# Patient Record
Sex: Male | Born: 1975 | Hispanic: No | Marital: Married | Smoking: Former smoker
Health system: Southern US, Community
[De-identification: ages and names within clinical notes are randomized; demographics above are authoritative.]

## PROBLEM LIST (undated history)

## (undated) DIAGNOSIS — Z8619 Personal history of other infectious and parasitic diseases: Secondary | ICD-10-CM

## (undated) DIAGNOSIS — F329 Major depressive disorder, single episode, unspecified: Secondary | ICD-10-CM

## (undated) DIAGNOSIS — J309 Allergic rhinitis, unspecified: Secondary | ICD-10-CM

## (undated) DIAGNOSIS — F439 Reaction to severe stress, unspecified: Secondary | ICD-10-CM

## (undated) DIAGNOSIS — F32A Depression, unspecified: Secondary | ICD-10-CM

## (undated) HISTORY — DX: Reaction to severe stress, unspecified: F43.9

## (undated) HISTORY — DX: Personal history of other infectious and parasitic diseases: Z86.19

## (undated) HISTORY — DX: Major depressive disorder, single episode, unspecified: F32.9

## (undated) HISTORY — DX: Allergic rhinitis, unspecified: J30.9

## (undated) HISTORY — PX: NO PAST SURGERIES: SHX2092

## (undated) HISTORY — DX: Depression, unspecified: F32.A

---

## 2008-08-15 ENCOUNTER — Ambulatory Visit: Payer: Self-pay | Admitting: Family Medicine

## 2009-05-30 ENCOUNTER — Emergency Department (HOSPITAL_COMMUNITY): Admission: EM | Admit: 2009-05-30 | Discharge: 2009-05-30 | Payer: Self-pay | Admitting: Emergency Medicine

## 2010-02-24 DIAGNOSIS — F411 Generalized anxiety disorder: Secondary | ICD-10-CM | POA: Insufficient documentation

## 2010-06-22 ENCOUNTER — Inpatient Hospital Stay: Payer: Self-pay | Admitting: Psychiatry

## 2010-12-23 ENCOUNTER — Emergency Department: Payer: Self-pay | Admitting: Otolaryngology

## 2014-03-25 ENCOUNTER — Emergency Department: Payer: Self-pay | Admitting: Emergency Medicine

## 2014-03-25 LAB — CBC WITH DIFFERENTIAL/PLATELET
BASOS PCT: 0.4 %
Basophil #: 0 10*3/uL (ref 0.0–0.1)
EOS ABS: 0.1 10*3/uL (ref 0.0–0.7)
EOS PCT: 1.1 %
HCT: 39 % — AB (ref 40.0–52.0)
HGB: 13.4 g/dL (ref 13.0–18.0)
LYMPHS PCT: 7.9 %
Lymphocyte #: 0.7 10*3/uL — ABNORMAL LOW (ref 1.0–3.6)
MCH: 29.6 pg (ref 26.0–34.0)
MCHC: 34.4 g/dL (ref 32.0–36.0)
MCV: 86 fL (ref 80–100)
MONOS PCT: 4.8 %
Monocyte #: 0.4 x10 3/mm (ref 0.2–1.0)
NEUTROS ABS: 7.6 10*3/uL — AB (ref 1.4–6.5)
Neutrophil %: 85.8 %
Platelet: 230 10*3/uL (ref 150–440)
RBC: 4.54 10*6/uL (ref 4.40–5.90)
RDW: 12.7 % (ref 11.5–14.5)
WBC: 8.9 10*3/uL (ref 3.8–10.6)

## 2014-03-25 LAB — COMPREHENSIVE METABOLIC PANEL
ALBUMIN: 3.6 g/dL (ref 3.4–5.0)
ALK PHOS: 60 U/L (ref 46–116)
ALT: 98 U/L — AB (ref 14–63)
AST: 63 U/L — AB (ref 15–37)
Anion Gap: 8 (ref 7–16)
BUN: 7 mg/dL (ref 7–18)
Bilirubin,Total: 0.7 mg/dL (ref 0.2–1.0)
CALCIUM: 8.6 mg/dL (ref 8.5–10.1)
CO2: 26 mmol/L (ref 21–32)
Chloride: 105 mmol/L (ref 98–107)
Creatinine: 1 mg/dL (ref 0.60–1.30)
EGFR (African American): >60
GLUCOSE: 104 mg/dL — AB (ref 65–99)
Osmolality: 276 (ref 275–301)
Potassium: 3.3 mmol/L — ABNORMAL LOW (ref 3.5–5.1)
Sodium: 139 mmol/L (ref 136–145)
Total Protein: 7.2 g/dL (ref 6.4–8.2)

## 2014-03-25 LAB — URINALYSIS, COMPLETE
BILIRUBIN, UR: NEGATIVE
BLOOD: NEGATIVE
Bacteria: NONE SEEN
GLUCOSE, UR: NEGATIVE mg/dL (ref 0–75)
KETONE: NEGATIVE
Leukocyte Esterase: NEGATIVE
NITRITE: NEGATIVE
Ph: 5 (ref 4.5–8.0)
Protein: 30
RBC,UR: NONE SEEN /HPF (ref 0–5)
SPECIFIC GRAVITY: 1.017 (ref 1.003–1.030)
Squamous Epithelial: NONE SEEN
WBC UR: 1 /HPF (ref 0–5)

## 2014-03-27 ENCOUNTER — Emergency Department: Payer: Self-pay | Admitting: Emergency Medicine

## 2014-03-31 LAB — CULTURE, BLOOD (SINGLE)

## 2015-07-09 ENCOUNTER — Encounter: Payer: Self-pay | Admitting: Family Medicine

## 2015-07-09 ENCOUNTER — Ambulatory Visit (INDEPENDENT_AMBULATORY_CARE_PROVIDER_SITE_OTHER): Payer: BLUE CROSS/BLUE SHIELD | Admitting: Family Medicine

## 2015-07-09 VITALS — BP 100/74 | HR 81 | Temp 98.6°F | Resp 16 | Ht 70.0 in | Wt 175.6 lb

## 2015-07-09 DIAGNOSIS — F439 Reaction to severe stress, unspecified: Secondary | ICD-10-CM | POA: Insufficient documentation

## 2015-07-09 DIAGNOSIS — F329 Major depressive disorder, single episode, unspecified: Secondary | ICD-10-CM | POA: Insufficient documentation

## 2015-07-09 DIAGNOSIS — W57XXXA Bitten or stung by nonvenomous insect and other nonvenomous arthropods, initial encounter: Secondary | ICD-10-CM

## 2015-07-09 DIAGNOSIS — S30861A Insect bite (nonvenomous) of abdominal wall, initial encounter: Secondary | ICD-10-CM

## 2015-07-09 DIAGNOSIS — A630 Anogenital (venereal) warts: Secondary | ICD-10-CM | POA: Diagnosis not present

## 2015-07-09 DIAGNOSIS — J309 Allergic rhinitis, unspecified: Secondary | ICD-10-CM | POA: Insufficient documentation

## 2015-07-09 DIAGNOSIS — F32A Depression, unspecified: Secondary | ICD-10-CM | POA: Insufficient documentation

## 2015-07-09 MED ORDER — DOXYCYCLINE HYCLATE 100 MG PO TABS: 100.0000 mg | ORAL_TABLET | Freq: Two times a day (BID) | ORAL | Status: DC

## 2015-07-09 MED ORDER — IMIQUIMOD 3.75 % EX CREA: 1.0000 "application " | TOPICAL_CREAM | CUTANEOUS | Status: DC

## 2015-07-09 NOTE — Progress Notes (Signed)
Subjective:     Patient ID: Glenn Rodriguez, male   DOB: 21-Feb-1976, 40 y.o.   MRN: [id number]  HPI  Chief Complaint  Patient presents with  . Insect Bite    Patient comes in office today with concerns of tick bite that he believes attatched to his skin 3-4 days ago. Patient states that he found tick on the right side of his abdomen, he soak his skin with rubbing alcohol and plucked it with tweezers. Patient states that he pulled entire tick off and was able to see head still attached. Patient states that he has developed redness and swelling and site of bite and now has redness around area.   Has been applying Benadryl cream with modest improvement. Also wishes to try Aldara cream for his genital warts.   Review of Systems     Objective:   Physical Exam  Constitutional: He appears well-developed and well-nourished. No distress.  Skin:  Right upper abdomen with insect bite with approx. 4 cm. Inflammatory flare.        Assessment:    1. Tick bite of abdomen, initial encounter - doxycycline (VIBRA-TABS) 100 MG tablet; Take 1 tablet (100 mg total) by mouth 2 (two) times daily.  Dispense: 20 tablet; Refill: 0  2. Genital warts - Imiquimod 3.75 % CREA; Apply 1 application topically 3 (three) times a week. 3 x week at bedtime for up to 16 weeks. Wash off after 8 hours.  Dispense: 7.5 each; Refill: 1    Plan:    Discussed trying hydrocortisone cream to his tick bite first. If increasing redness or flu like symptoms to start doxycycline.

## 2015-07-09 NOTE — Patient Instructions (Signed)
Discussed use of hydrocortisone cream for the tick bite.Start doxycycline if rash widening or you develop flu-like symptoms.

## 2015-10-13 IMAGING — CR DG CHEST 2V
1 series · 2 of 2 positions shown · non-contrast
Comparison: None.

CLINICAL DATA: Cough and intermittent fever since 03/22/2014.

EXAM:
CHEST  2 VIEW

[Series 1: dxr chest pa (or ap) and lateral · 0.14mm/px · 2 of 2 slices shown]
[im 1/2]
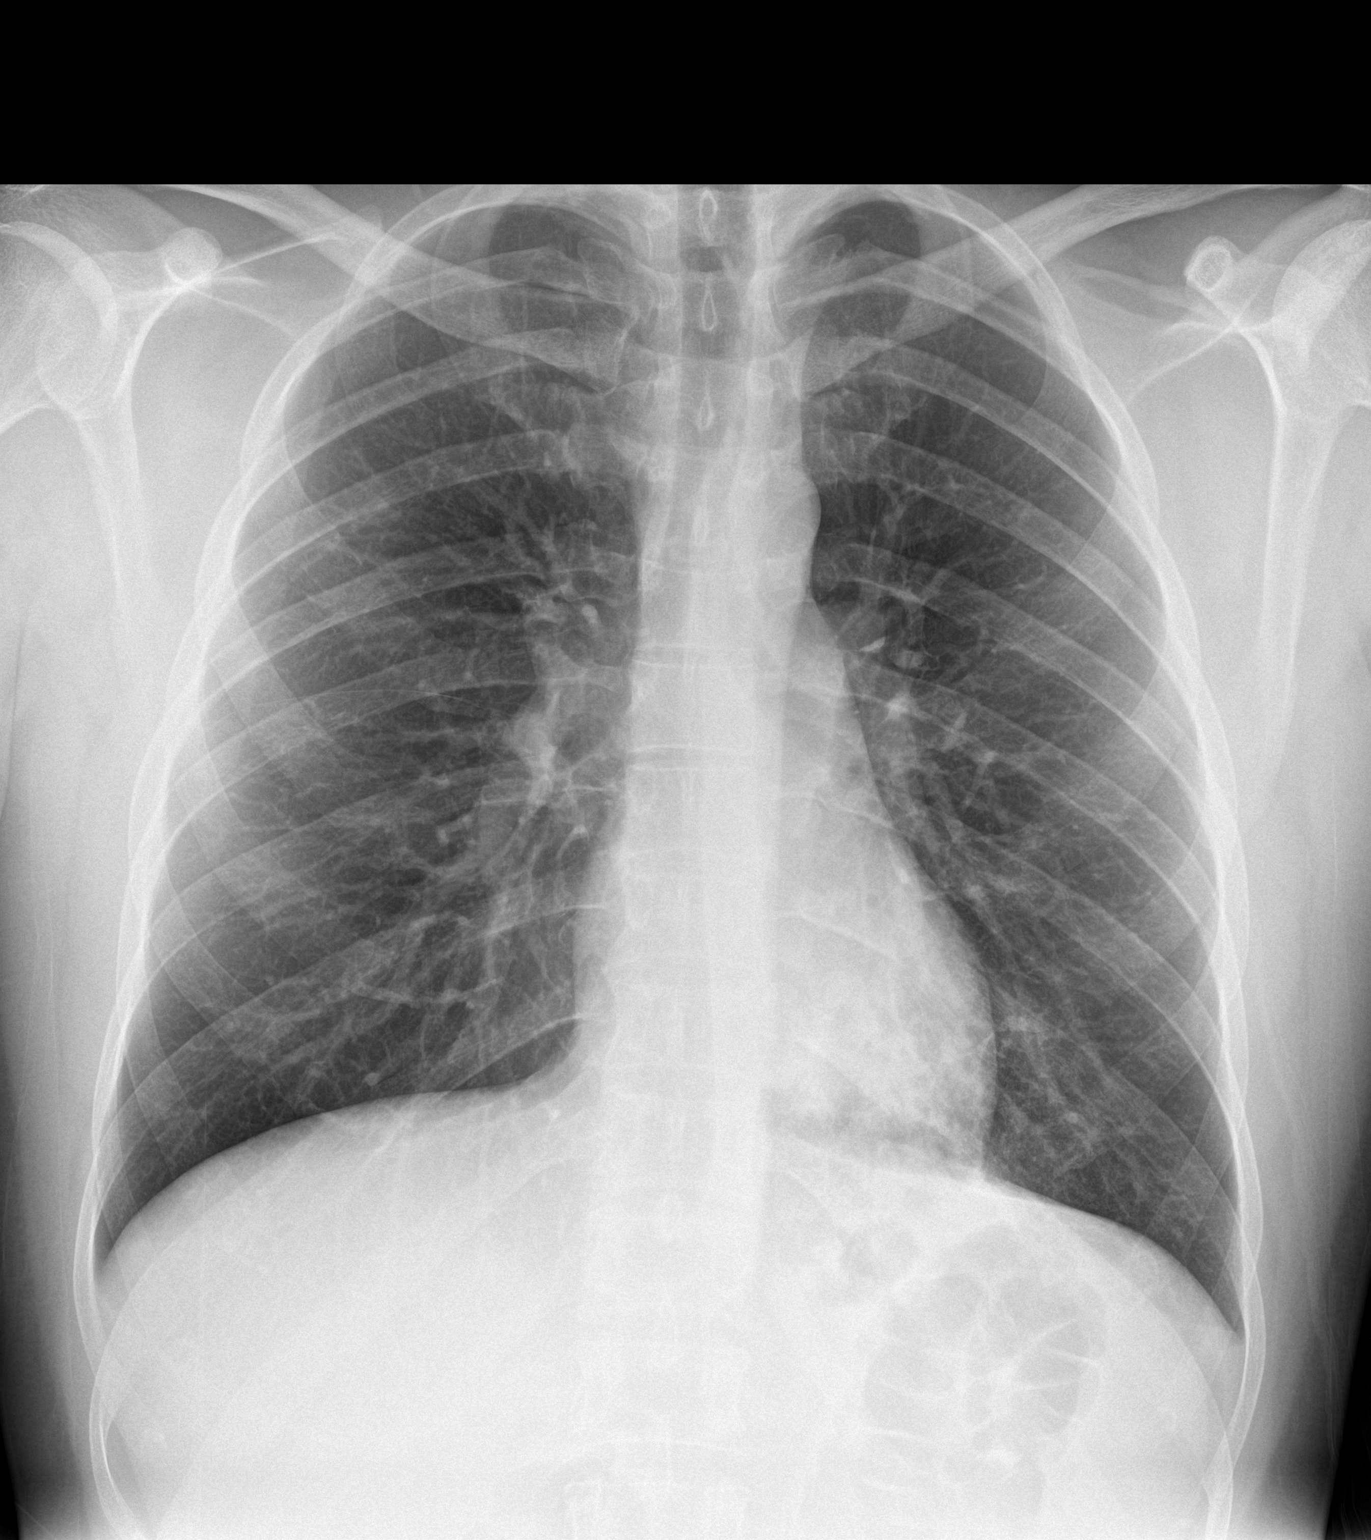
[im 2/2]
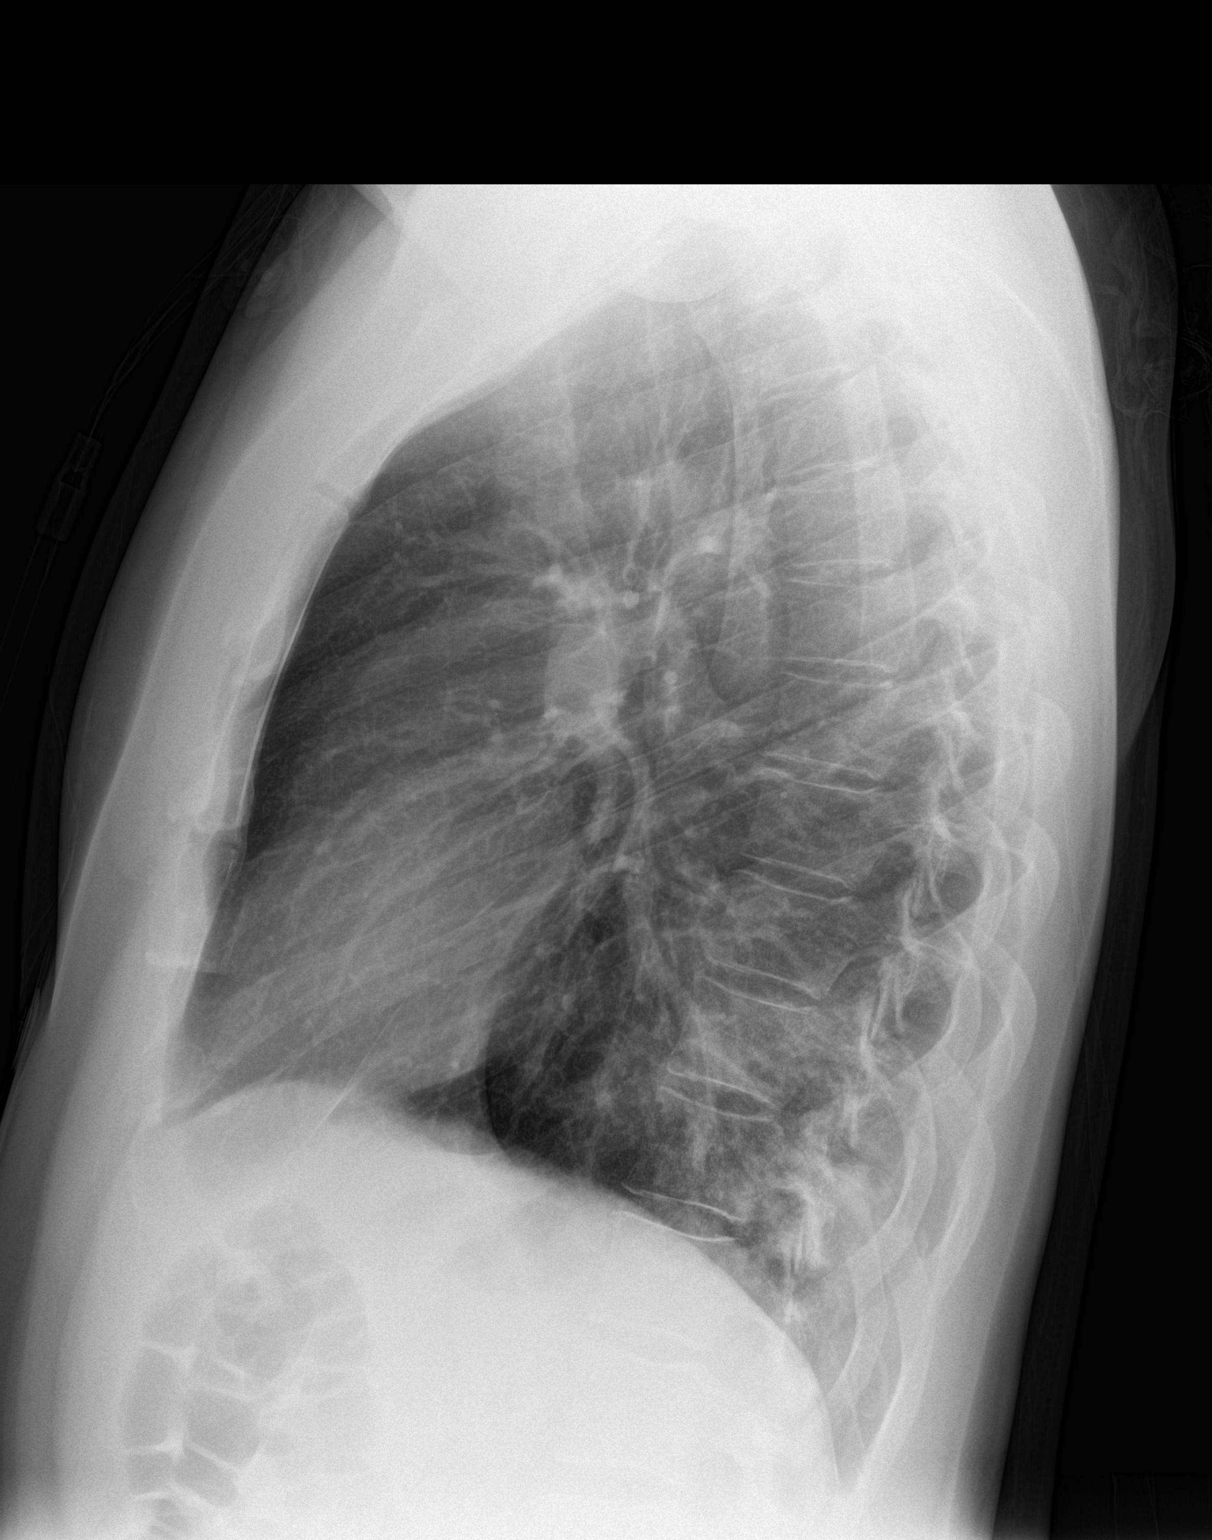

[2 of 2 positions shown; findings below may reference images not displayed]

FINDINGS: Normal heart size and pulmonary vascularity. Emphysematous changes
in the lungs. No focal airspace disease or consolidation. No
blunting of costophrenic angles. No pneumothorax. Mediastinal
contours appear intact. Visualized bones appear intact.
IMPRESSION: Emphysematous changes in the lungs. No evidence of active pulmonary
disease.

## 2015-10-14 ENCOUNTER — Emergency Department
Admission: EM | Admit: 2015-10-14 | Discharge: 2015-10-14 | Disposition: A | Discharge status: Home / Self Care | Payer: BLUE CROSS/BLUE SHIELD | Attending: Emergency Medicine | Admitting: Emergency Medicine

## 2015-10-14 ENCOUNTER — Encounter: Payer: Self-pay | Admitting: Emergency Medicine

## 2015-10-14 DIAGNOSIS — R42 Dizziness and giddiness: Secondary | ICD-10-CM | POA: Diagnosis not present

## 2015-10-14 DIAGNOSIS — Z7951 Long term (current) use of inhaled steroids: Secondary | ICD-10-CM | POA: Insufficient documentation

## 2015-10-14 DIAGNOSIS — R0602 Shortness of breath: Secondary | ICD-10-CM | POA: Diagnosis present

## 2015-10-14 LAB — BASIC METABOLIC PANEL
ANION GAP: 11 (ref 5–15)
BUN: 20 mg/dL (ref 6–20)
CALCIUM: 9.7 mg/dL (ref 8.9–10.3)
CHLORIDE: 105 mmol/L (ref 101–111)
CO2: 22 mmol/L (ref 22–32)
CREATININE: 1.16 mg/dL (ref 0.61–1.24)
GFR calc non Af Amer: >60 mL/min (ref >60)
Glucose, Bld: 107 mg/dL — ABNORMAL HIGH (ref 65–99)
Potassium: 4 mmol/L (ref 3.5–5.1)
SODIUM: 138 mmol/L (ref 135–145)

## 2015-10-14 LAB — CBC
HCT: 44 % (ref 40.0–52.0)
HEMOGLOBIN: 15.3 g/dL (ref 13.0–18.0)
MCH: 29.7 pg (ref 26.0–34.0)
MCHC: 34.7 g/dL (ref 32.0–36.0)
MCV: 85.7 fL (ref 80.0–100.0)
PLATELETS: 309 10*3/uL (ref 150–440)
RBC: 5.14 MIL/uL (ref 4.40–5.90)
RDW: 12.8 % (ref 11.5–14.5)
WBC: 7.1 10*3/uL (ref 3.8–10.6)

## 2015-10-14 LAB — TROPONIN I

## 2015-10-14 MED ORDER — SODIUM CHLORIDE 0.9 % IV BOLUS (SEPSIS)
1000.0000 mL | Freq: Once | INTRAVENOUS | Status: AC
  Administered 2015-10-14: 1000 mL via INTRAVENOUS

## 2015-10-14 MED ORDER — LORAZEPAM 2 MG/ML IJ SOLN
0.5000 mg | Freq: Once | INTRAMUSCULAR | Status: AC
  Administered 2015-10-14: 0.5 mg via INTRAVENOUS
  Filled 2015-10-14: qty 1

## 2015-10-14 NOTE — ED Triage Notes (Signed)
Pt presents to ED via EMS from church c/o feelin jittery, dizzy, lightheaded, and short of breath starting around 1120. Pt states it feels similar to previous anxiety attacks but states this one is worse. CBG 119 per EMS. No c/o pain at this time.

## 2015-10-14 NOTE — ED Provider Notes (Signed)
[facility] Emergency Department Provider Note  Time seen: 1:57 PM  I have reviewed the triage vital signs and the nursing notes.   HISTORY  Chief Complaint Anxiety and Shortness of Breath    HPI Glenn Rodriguez is a 40 y.o. male with a past medical history of anxiety, depression, who presents the emergency department with dizziness/lightheadedness. According to the patient last week he went for a run, states he ran approximately 1 mile however upon finishing he felt like his heart was racing, he felt lightheaded and dizzy like he was going to pass out. States the symptoms last approximately 10-15 minutes and then resolved on their own. Today the patient states he worked out this morning, and then he went to church, while at church she began feeling very similar with lightheadedness like he was going to pass out. Patient states a history of anxiety and panic attacks was somewhat similar presentations in the past but never this severe. He states his symptoms have resolved at this time, and he feels mostly back to normal. Denies any chest pain at any point. Denies any trouble breathing. Patient does state he uses supplements while at the gym, and drinks energy drinks.  Past Medical History:  Diagnosis Date  . Allergic rhinitis   . Depression   . History of genital warts   . Situational stress     Patient Active Problem List   Diagnosis Date Noted  . Feeling stressed out 07/09/2015  . Genital warts 07/09/2015  . Allergic rhinitis 07/09/2015    Past Surgical History:  Procedure Laterality Date  . NO PAST SURGERIES      Prior to Admission medications   Medication Sig Start Date End Date Taking? Authorizing Provider  albuterol (PROVENTIL HFA;VENTOLIN HFA) 108 (90 Base) MCG/ACT inhaler Inhale 2 puffs into the lungs every 6 (six) hours as needed for wheezing or shortness of breath.   Yes Historical Provider, MD  clonazePAM (KLONOPIN) 0.5 MG tablet Take 0.5 mg by  mouth 2 (two) times daily as needed for anxiety.  06/08/14  Yes Historical Provider, MD  doxycycline (VIBRA-TABS) 100 MG tablet Take 1 tablet (100 mg total) by mouth 2 (two) times daily. Patient not taking: Reported on 10/14/2015 07/09/15   Anola Gurneyobert Chauvin, PA  Imiquimod 3.75 % CREA Apply 1 application topically 3 (three) times a week. 3 x week at bedtime for up to 16 weeks. Wash off after 8 hours. 07/09/15   Anola Gurneyobert Chauvin, PA    Allergies  Allergen Reactions  . Amoxicillin Other (See Comments)    Childhood reaction-unknown  . Anesthetics, Halogenated Other (See Comments)    Unknown reaction    Family History  Problem Relation Age of Onset  . Depression Mother     Social History Social History  Substance Use Topics  . Smoking status: Never Smoker  . Smokeless tobacco: Never Used  . Alcohol use 0.0 oz/week     Comment: occasional     Review of Systems Constitutional: Negative for fever. Cardiovascular: Negative for chest pain.Dizziness/lightheadedness. Respiratory: Negative for shortness of breath. Gastrointestinal: Negative for abdominal pain Neurological: Negative for headache 10-point ROS otherwise negative.  ____________________________________________   PHYSICAL EXAM:  VITAL SIGNS: ED Triage Vitals [10/14/15 1215]  Enc Vitals Group     BP (!) 139/91     Pulse Rate 86     Resp 20     Temp 97.8 F (36.6 C)     Temp Source Oral     SpO2  100 %     Weight 178 lb (80.7 kg)     Height 5\' 10"  (1.778 m)     Head Circumference      Peak Flow      Pain Score      Pain Loc      Pain Edu?      Excl. in GC?     Constitutional: Alert and oriented. Well appearing and in no distress. Eyes: Normal exam ENT   Head: Normocephalic and atraumatic   Mouth/Throat: Mucous membranes are moist. Cardiovascular: Normal rate, regular rhythm. No murmurs, rubs, or gallops. Respiratory: Normal respiratory effort without tachypnea nor retractions. Breath sounds are clear and  equal bilaterally. No wheezes/rales/rhonchi. Gastrointestinal: Soft and nontender. No distention Musculoskeletal: Nontender with normal range of motion in all extremities.  Neurologic:  Normal speech and language. No gross focal neurologic deficits  Skin:  Skin is warm, dry and intact.  Psychiatric: Mood and affect are normal. Speech and behavior are normal.   ____________________________________________     EKG   EKG reviewed and interpreted by myself shows normal sinus rhythm at 71 bpm, narrow QRS, normal axis, normal intervals, no concerning ST changes.  ____________________________________________   INITIAL IMPRESSION / ASSESSMENT AND PLAN / ED COURSE  Pertinent labs & imaging results that were available during my care of the patient were reviewed by me and considered in my medical decision making (see chart for details).  The patient presents emergency department with dizziness/lightheadedness. States his symptoms have largely resolved with just mild lightheadedness currently. Denies any chest pain at any point. Patient states his main concern is making sure he has not suffered a heart attack. Denies any chest pain.  Patient's labs are within normal limits. Troponin is negative. EKG is reassuring. Patient just a small amount of Ativan and IV fluids in the emergency department, states he feels much better. I discussed with the patient the need to follow up with cardiology for Holter monitor test. Patient is agreeable to this plan.  ____________________________________________   FINAL CLINICAL IMPRESSION(S) / ED DIAGNOSES  Dizziness    Minna AntisKevin Maeve Debord, MD 10/14/15 1401

## 2015-10-14 NOTE — Discharge Instructions (Signed)
Please call the number provided for cardiology to arrange a Holter monitor test. Please return to the emergency department for any chest pain, trouble breathing, or any further concerning lightheadedness/dizziness.

## 2015-10-14 NOTE — ED Notes (Signed)

## 2015-10-16 ENCOUNTER — Other Ambulatory Visit: Payer: Self-pay | Admitting: Family Medicine

## 2015-10-16 NOTE — Telephone Encounter (Signed)
Rx has been called into pharmacy. KW 

## 2015-10-19 ENCOUNTER — Ambulatory Visit (INDEPENDENT_AMBULATORY_CARE_PROVIDER_SITE_OTHER): Payer: BLUE CROSS/BLUE SHIELD | Admitting: Family Medicine

## 2015-10-19 ENCOUNTER — Encounter: Payer: Self-pay | Admitting: Family Medicine

## 2015-10-19 VITALS — BP 114/80 | HR 86 | Temp 98.7°F | Resp 16 | Wt 178.6 lb

## 2015-10-19 DIAGNOSIS — Z658 Other specified problems related to psychosocial circumstances: Secondary | ICD-10-CM

## 2015-10-19 DIAGNOSIS — F439 Reaction to severe stress, unspecified: Secondary | ICD-10-CM

## 2015-10-19 DIAGNOSIS — R002 Palpitations: Secondary | ICD-10-CM
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
## 2015-10-19 NOTE — Patient Instructions (Addendum)
We will call you with the lab work. Do schedule clonazepam daily. Follow up with cardiology as scheduled.

## 2015-10-20 LAB — TSH: TSH: 0.578 u[IU]/mL (ref 0.450–4.500)

## 2015-10-20 LAB — T4, FREE: Free T4: 1.13 ng/dL (ref 0.82–1.77)

## 2015-10-22 ENCOUNTER — Telehealth: Payer: Self-pay

## 2015-10-22 NOTE — Telephone Encounter (Signed)
-----   Message from Anola Gurneyobert Chauvin, [place] sent at 10/22/2015  7:33 AM EDT ----- Thyroid is norma. F/u with cardiology as scheduled.

## 2015-10-22 NOTE — Telephone Encounter (Signed)
Patient has been advised. KW 

## 2015-10-23 ENCOUNTER — Encounter: Payer: Self-pay | Admitting: Cardiology

## 2015-10-23 ENCOUNTER — Ambulatory Visit (INDEPENDENT_AMBULATORY_CARE_PROVIDER_SITE_OTHER): Payer: BLUE CROSS/BLUE SHIELD | Admitting: Cardiology

## 2015-10-23 VITALS — BP 110/80 | HR 66 | Ht 70.0 in | Wt 176.8 lb

## 2015-10-23 DIAGNOSIS — R002 Palpitations: Secondary | ICD-10-CM | POA: Diagnosis not present

## 2015-10-23 DIAGNOSIS — R0602 Shortness of breath: Secondary | ICD-10-CM

## 2015-10-23 DIAGNOSIS — Z7189 Other specified counseling: Secondary | ICD-10-CM | POA: Diagnosis not present

## 2015-10-23 DIAGNOSIS — Z7689 Persons encountering health services in other specified circumstances: Secondary | ICD-10-CM

## 2015-10-23 DIAGNOSIS — R079 Chest pain, unspecified: Secondary | ICD-10-CM | POA: Diagnosis not present

## 2015-10-23 NOTE — Progress Notes (Signed)
Cardiology Office Note   Date:  10/23/2015   ID:  Glenn Rodriguez, DOB March 10, 1975, MRN [id number]  Referring Doctor:  Carmon Ginsberg, PA   Cardiologist:   Wende Bushy, MD   Reason for consultation:  Chief Complaint  Patient presents with  . Establish Care    follow to ED visit for SOB      History of Present Illness: Glenn Rodriguez is a 40 y.o. male who presents for Evaluation of a myriad of symptoms: Shortness of breath, palpitations, chest tightness.  Patient first noted symptoms approximately 2 months ago. He was doing his usual workout chest and upper extremity exercises. He was doing a series of lifts of approximately 65 pounds. He was doing multiple sets of 20s. He noticed that he was starting to feel lightheaded vision turning dark. He also had onset of rapid heart rate that was associated with the shortness of breath and chest tightness. The symptoms are moderate to severe in intensity, lasted at the tail end of his exercise and for a few minutes after that. He was thinking that if he started running and take deep breaths, this may help with his symptoms. He ran for half a mile and then walked another half a mile after that. After this, he started having lightheadedness again together with mild chest tightness and palpitations.  Another episode while at church. He had been standing up for approximately 10 minutes when he started feeling lightheaded and faint. He noticed his heart was racing, this was associated with chest tightness and shortness of breath. His symptoms were severe and he ended up having them call EMS. When EMS came, his blood pressure was noted to be 180/110 with heart rates in the 140s to 150s. When he got to the emergency room, his blood pressure was 333 systolic. His heart rate had improved by then. It was thought that his symptoms may be related to anxiety or panic attacks. He has a history of this. But it was thought that he may need to be ruled out for any  cardiac issue.  His PCP advised him to stop drinking his Monster energy drinks. He continues to drink his one serving of coffee in the morning. This is equivalent to a grande serving, very strong coffee. He also continues to drink one to 3 beers in the evenings.  No true syncope. No PND, orthopnea, edema. No fever, cough, colds, abdominal pain.  He does not carry a diagnosis of asthma. Albuterol inhaler was prescribed to him after he had pneumonia.   ROS:  Please see the history of present illness. Aside from mentioned under HPI, all other systems are reviewed and negative.     Past Medical History:  Diagnosis Date  . Allergic rhinitis   . Depression   . History of genital warts   . Situational stress     Past Surgical History:  Procedure Laterality Date  . NO PAST SURGERIES       reports that he has never smoked. He has never used smokeless tobacco. He reports that he drinks alcohol. He reports that he does not use drugs. He quit smoking 14 years ago. He quit chewing tobacco for 25 years ago.  family history includes Depression in his mother. There is history of arrhythmia in the mother and maternal aunt and maternal grandmother. No known CAD.  Outpatient Medications Prior to Visit  Medication Sig Dispense Refill  . albuterol (PROVENTIL HFA;VENTOLIN HFA) 108 (90 Base) MCG/ACT inhaler Inhale  2 puffs into the lungs every 6 (six) hours as needed for wheezing or shortness of breath.    . clonazePAM (KLONOPIN) 0.5 MG tablet TAKE 1 TABLET BY MOUTH TWICE DAILY AS NEEDED FOR ANXIETY AND STRESS 28 tablet 1  . Imiquimod 3.75 % CREA Apply 1 application topically 3 (three) times a week. 3 x week at bedtime for up to 16 weeks. Wash off after 8 hours. 7.5 each 1   No facility-administered medications prior to visit.      Allergies: Amoxicillin and Anesthetics, halogenated    PHYSICAL EXAM: VS:  BP 110/80 (BP Location: Right Arm, Patient Position: Sitting, Cuff Size: Normal)   Pulse 66    Ht 5' 10"  (1.778 m)   Wt 176 lb 12.8 oz (80.2 kg)   SpO2 98%   BMI 25.37 kg/m  , Body mass index is 25.37 kg/m. Wt Readings from Last 3 Encounters:  10/23/15 176 lb 12.8 oz (80.2 kg)  10/19/15 178 lb 9.6 oz (81 kg)  10/14/15 178 lb (80.7 kg)    GENERAL:  well developed, well nourished, not in acute distress HEENT: normocephalic, pink conjunctivae, anicteric sclerae, no xanthelasma, normal dentition, oropharynx clear NECK:  no neck vein engorgement, JVP normal, no hepatojugular reflux, carotid upstroke brisk and symmetric, no bruit, no thyromegaly, no lymphadenopathy LUNGS:  good respiratory effort, clear to auscultation bilaterally CV:  PMI not displaced, no thrills, no lifts, S1 and S2 within normal limits, no palpable S3 or S4, no murmurs, no rubs, no gallops ABD:  Soft, nontender, nondistended, normoactive bowel sounds, no abdominal aortic bruit, no hepatomegaly, no splenomegaly MS: nontender back, no kyphosis, no scoliosis, no joint deformities EXT:  2+ DP/PT pulses, no edema, no varicosities, no cyanosis, no clubbing SKIN: warm, nondiaphoretic, normal turgor, no ulcers NEUROPSYCH: alert, oriented to person, place, and time, sensory/motor grossly intact, normal mood, appropriate affect  Recent Labs: 10/14/2015: BUN 20; Creatinine, Ser 1.16; Hemoglobin 15.3; Platelets 309; Potassium 4.0; Sodium 138 10/19/2015: TSH 0.578   Lipid Panel No results found for: CHOL, TRIG, HDL, CHOLHDL, VLDL, LDLCALC, LDLDIRECT   Other studies Reviewed:  EKG:  The ekg from 10/14/2015, and the ER was personally reviewed by me and it revealed sinus rhythm, 71 bpm.  EKG from 10/23/2015 was personally reviewed by me and it revealed sinus rhythm, 64 BPM.  Additional studies/ records that were reviewed personally reviewed by me today include: None available   ASSESSMENT AND PLAN: Paplpitations that leads to shortness of breath and chest tightness. Episodes of lightheadedness  Recommend further  evaluation with an echocardiogram Recommend cardiac event monitor, patient does not have daily palpitations. Also need to rule out ischemia with a stress echocardiogram.   Current medicines are reviewed at length with the patient today.  The patient does not have concerns regarding medicines.  Labs/ tests ordered today include:  Orders Placed This Encounter  Procedures  . Cardiac event monitor  . EKG 12-Lead  . ECHOCARDIOGRAM COMPLETE  . ECHOCARDIOGRAM STRESS TEST    I had a lengthy and detailed discussion with the patient regarding diagnoses, prognosis, diagnostic options, treatment options , and side effects of medications.   I counseled the patient on importance of lifestyle modification including heart healthy diet, regular physical activity.   Disposition:   FU with undersigned after tests   I spent at least 60 minutes with the patient today and more than 50% of the time was spent counseling the patient and coordinating care.     Signed, Wende Bushy,  MD  10/23/2015 11:15 AM    [place]  This note was generated in part with voice recognition software and I apologize for any typographical errors that were not detected and corrected.

## 2015-10-23 NOTE — Patient Instructions (Addendum)
Testing/Procedures: Your physician has requested that you have an echocardiogram. Echocardiography is a painless test that uses sound waves to create images of your heart. It provides your doctor with information about the size and shape of your heart and how well your heart's chambers and valves are working. This procedure takes approximately one hour. There are no restrictions for this procedure.  Your physician has requested that you have a stress echocardiogram. For further information please visit [email or web address]. Please follow instruction sheet as given.  Your physician has recommended that you wear an event monitor. Event monitors are medical devices that record the heart's electrical activity. Doctors most often us these monitors to diagnose arrhythmias. Arrhythmias are problems with the speed or rhythm of the heartbeat. The monitor is a small, portable device. You can wear one while you do your normal daily activities. This is usually used to diagnose what is causing palpitations/syncope (passing out).    Follow-Up: Your physician recommends that you schedule a follow-up appointment after testing with Dr. Alvino ChapelIngal.   It was a pleasure seeing you today here in the office. Please do not hesitate to give us a call back if you have any further questions. [phone]  [place] CellarPamela A. RN, BSN    Echocardiogram An echocardiogram, or echocardiography, uses sound waves (ultrasound) to produce an image of your heart. The echocardiogram is simple, painless, obtained within a short period of time, and offers valuable information to your health care provider. The images from an echocardiogram can provide information such as:  Evidence of coronary artery disease (CAD).  Heart size.  Heart muscle function.  Heart valve function.  Aneurysm detection.  Evidence of a past heart attack.  Fluid buildup around the heart.  Heart muscle thickening.  Assess heart valve function. LET [facility] PROVIDER KNOW ABOUT:  Any allergies you have.  All medicines you are taking, including vitamins, herbs, eye drops, creams, and over-the-counter medicines.  Previous problems you or members of your family have had with the use of anesthetics.  Any blood disorders you have.  Previous surgeries you have had.  Medical conditions you have.  Possibility of pregnancy, if this applies. BEFORE THE PROCEDURE  No special preparation is needed. Eat and drink normally.  PROCEDURE   In order to produce an image of your heart, gel will be applied to your chest and a wand-like tool (transducer) will be moved over your chest. The gel will help transmit the sound waves from the transducer. The sound waves will harmlessly bounce off your heart to allow the heart images to be captured in real-time motion. These images will then be recorded.  You may need an IV to receive a medicine that improves the quality of the pictures. AFTER THE PROCEDURE You may return to your normal schedule including diet, activities, and medicines, unless your health care provider tells you otherwise.   This information is not intended to replace advice given to you by your health care provider. Make sure you discuss any questions you have with your health care provider.   Document Released: 02/08/2000 Document Revised: 03/03/2014 Document Reviewed: 10/18/2012 Elsevier Interactive Patient Education 2016 [employer]     Exercise Stress Echocardiogram An exercise stress echocardiogram is a heart (cardiac) test used to check the function of your heart. This test may also be called an exercise stress echocardiography or stress echo. This stress test will check how well your heart muscle and valves are working and determine if your heart muscle is getting  enough blood. You will exercise on a treadmill to naturally increase or stress the functioning of your heart.  An echocardiogram uses sound waves (ultrasound) to produce  an image of your heart. If your heart does not work normally, it may indicate coronary artery disease with poor coronary blood supply. The coronary arteries are the arteries that bring blood and oxygen to your heart. LET [facility] CARE PROVIDER KNOW ABOUT:  Any allergies you have.  All medicines you are taking, including vitamins, herbs, eye drops, creams, and over-the-counter medicines.  Previous problems you or members of your family have had with the use of anesthetics.  Any blood disorders you have.  Previous surgeries you have had.  Medical conditions you have.  Possibility of pregnancy, if this applies. RISKS AND COMPLICATIONS Generally, this is a safe procedure. However, as with any procedure, complications can occur. Possible complications can include:  You develop pain or pressure in the following areas:  Chest.  Jaw or neck.  Between your shoulder blades.  Radiating down your left arm.  Dizziness or lightheadedness.  Shortness of breath.  Increased or irregular heartbeat.  Nausea or vomiting.  Heart attack (rare). BEFORE THE PROCEDURE  Avoid all forms of caffeine for 24 hours before your test or as directed by your health care provider. This includes coffee, tea (even decaffeinated tea), caffeinated sodas, chocolate, cocoa, and certain pain medicines.  Follow your health care provider's instructions regarding eating and drinking before the test.  Take your medicines as directed at regular times with water unless instructed otherwise. Exceptions may include:  If you have diabetes, ask how you are to take your insulin or pills. It is common to adjust insulin dosing the morning of the test.  If you are taking beta-blocker medicines, it is important to talk to your health care provider about these medicines well before the date of your test. Taking beta-blocker medicines may interfere with the test. In some cases, these medicines need to be changed or stopped  24 hours or more before the test.  If you wear a nitroglycerin patch, it may need to be removed prior to the test. Ask your health care provider if the patch should be removed before the test.  If you use an inhaler for any breathing condition, bring it with you to the test.  If you are an outpatient, bring a snack so you can eat right after the stress phase of the test.  Do not smoke for 4 hours prior to the test or as directed by your health care provider.  Wear loose-fitting clothes and comfortable shoes for the test. This test involves walking on a treadmill. PROCEDURE   Multiple electrodes will be put on your chest. If needed, small areas of your chest may be shaved to get better contact with the electrodes. Once the electrodes are attached to your body, multiple wires will be attached to the electrodes, and your heart rate will be monitored.  You will have an echocardiogram done at rest.  To produce this image of your heart, gel is applied to your chest, and a wand-like tool (transducer) is moved over the chest. The transducer sends the sound waves through the chest to create the moving images of your heart.  You may need an IV to receive a medication that improves the quality of the pictures.  You will then walk on a treadmill. The treadmill will be started at a slow pace. The treadmill speed and incline will gradually be increased  to raise your heart rate.  At the peak of exercise, the treadmill will be stopped. You will lie down immediately on a bed so that a second echocardiogram can be done to visualize your heart's motion with exercise.  The test usually takes 30-60 minutes to complete. AFTER THE PROCEDURE  Your heart rate and blood pressure will be monitored after the test.  You may return to your normal schedule, including diet, activities, and medicines, unless your health care provider tells you otherwise.   This information is not intended to replace advice given to  you by your health care provider. Make sure you discuss any questions you have with your health care provider.   Document Released: 02/15/2004 Document Revised: 02/15/2013 Document Reviewed: 10/18/2012 Elsevier Interactive Patient Education 2016 Elsevier Inc.       Cardiac Event Monitoring A cardiac event monitor is a small recording device used to help detect abnormal heart rhythms (arrhythmias). The monitor is used to record heart rhythm when noticeable symptoms such as the following occur:  Fast heartbeats (palpitations), such as heart racing or fluttering.  Dizziness.  Fainting or light-headedness.  Unexplained weakness. The monitor is wired to two electrodes placed on your chest. Electrodes are flat, sticky disks that attach to your skin. The monitor can be worn for up to 30 days. You will wear the monitor at all times, except when bathing.  HOW TO USE YOUR CARDIAC EVENT MONITOR A technician will prepare your chest for the electrode placement. The technician will show you how to place the electrodes, how to work the monitor, and how to replace the batteries. Take time to practice using the monitor before you leave the office. Make sure you understand how to send the information from the monitor to your health care provider. This requires a telephone with a landline, not a cell phone. You need to:  Wear your monitor at all times, except when you are in water:  Do not get the monitor wet.  Take the monitor off when bathing. Do not swim or use a hot tub with it on.  Keep your skin clean. Do not put body lotion or moisturizer on your chest.  Change the electrodes daily or any time they stop sticking to your skin. You might need to use tape to keep them on.  It is possible that your skin under the electrodes could become irritated. To keep this from happening, try to put the electrodes in slightly different places on your chest. However, they must remain in the area under your left  breast and in the upper right section of your chest.  Make sure the monitor is safely clipped to your clothing or in a location close to your body that your health care provider recommends.  Press the button to record when you feel symptoms of heart trouble, such as dizziness, weakness, light-headedness, palpitations, thumping, shortness of breath, unexplained weakness, or a fluttering or racing heart. The monitor is always on and records what happened slightly before you pressed the button, so do not worry about being too late to get good information.  Keep a diary of your activities, such as walking, doing chores, and taking medicine. It is especially important to note what you were doing when you pushed the button to record your symptoms. This will help your health care provider determine what might be contributing to your symptoms. The information stored in your monitor will be reviewed by your health care provider alongside your diary entries.  Send  the recorded information as recommended by your health care provider. It is important to understand that it will take some time for your health care provider to process the results.  Change the batteries as recommended by your health care provider. SEEK IMMEDIATE MEDICAL CARE IF:   You have chest pain.  You have extreme difficulty breathing or shortness of breath.  You develop a very fast heartbeat that persists.  You develop dizziness that does not go away.  You faint or constantly feel you are about to faint.   This information is not intended to replace advice given to you by your health care provider. Make sure you discuss any questions you have with your health care provider.   Document Released: 11/20/2007 Document Revised: 03/03/2014 Document Reviewed: 08/09/2012 Elsevier Interactive Patient Education [employer]

## 2015-10-30 ENCOUNTER — Ambulatory Visit: Payer: BLUE CROSS/BLUE SHIELD | Admitting: Cardiology

## 2015-11-06 ENCOUNTER — Ambulatory Visit (INDEPENDENT_AMBULATORY_CARE_PROVIDER_SITE_OTHER): Payer: BLUE CROSS/BLUE SHIELD

## 2015-11-06 DIAGNOSIS — R079 Chest pain, unspecified: Secondary | ICD-10-CM | POA: Diagnosis not present

## 2015-11-06 DIAGNOSIS — R002 Palpitations: Secondary | ICD-10-CM | POA: Diagnosis not present

## 2015-11-06 DIAGNOSIS — R0602 Shortness of breath: Secondary | ICD-10-CM | POA: Diagnosis not present

## 2015-11-15 ENCOUNTER — Other Ambulatory Visit: Payer: BLUE CROSS/BLUE SHIELD

## 2015-11-27 ENCOUNTER — Telehealth: Payer: Self-pay | Admitting: Cardiology

## 2015-11-27 ENCOUNTER — Ambulatory Visit (INDEPENDENT_AMBULATORY_CARE_PROVIDER_SITE_OTHER): Payer: BLUE CROSS/BLUE SHIELD

## 2015-11-27 ENCOUNTER — Other Ambulatory Visit: Payer: Self-pay

## 2015-11-27 DIAGNOSIS — R079 Chest pain, unspecified: Secondary | ICD-10-CM

## 2015-11-27 DIAGNOSIS — R002 Palpitations: Secondary | ICD-10-CM

## 2015-11-27 DIAGNOSIS — R0602 Shortness of breath: Secondary | ICD-10-CM

## 2015-11-27 LAB — ECHOCARDIOGRAM STRESS TEST
CHL CUP RESTING HR STRESS: 81 {beats}/min
CSEPEDS: 5 s
CSEPPHR: 187 {beats}/min
Estimated workload: 11.8 METS
Exercise duration (min): 10 min
MPHR: 182 {beats}/min
Percent HR: 102 %

## 2015-11-27 NOTE — Telephone Encounter (Signed)
Pt in the office today for an echo and stress echo. Albuterol inhaler on pt medication list. He did not bring it today and states it was prescribed a year ago when he had pneumonia. He has not used it since and feels comfortable proceeding with the stress echo without the inhaler in his possession.

## 2015-12-03 ENCOUNTER — Telehealth: Payer: Self-pay | Admitting: Cardiology

## 2015-12-03 NOTE — Telephone Encounter (Signed)
Patient returning call from Nurse.  Patient states that he will make his appt on 10-17.  He applied 30 day monitor on 11/06/15 .

## 2015-12-07 ENCOUNTER — Other Ambulatory Visit: Payer: Self-pay

## 2015-12-07 DIAGNOSIS — R002 Palpitations: Secondary | ICD-10-CM

## 2015-12-07 DIAGNOSIS — R079 Chest pain, unspecified: Secondary | ICD-10-CM

## 2015-12-11 ENCOUNTER — Ambulatory Visit (INDEPENDENT_AMBULATORY_CARE_PROVIDER_SITE_OTHER): Payer: BLUE CROSS/BLUE SHIELD | Admitting: Cardiology

## 2015-12-11 ENCOUNTER — Encounter: Payer: Self-pay | Admitting: Cardiology

## 2015-12-11 VITALS — BP 100/80 | HR 58 | Ht 70.0 in | Wt 176.4 lb

## 2015-12-11 DIAGNOSIS — R42 Dizziness and giddiness: Secondary | ICD-10-CM

## 2015-12-11 NOTE — Patient Instructions (Signed)
Follow-Up: Your physician recommends that you schedule a follow-up appointment as needed with Dr. Ingal.   It was a pleasure seeing you today here in the office. Please do not hesitate to give us a call back if you have any further questions. [phone]  Juliette Standre A. RN, BSN     

## 2015-12-11 NOTE — Progress Notes (Signed)
Cardiology Office Note   Date:  12/12/2015   ID:  Glenn Rodriguez, DOB [birth year], MRN [id number]  Referring Doctor:  Carmon Ginsberg, PA   Cardiologist:   Wende Bushy, MD   Reason for consultation:  No chief complaint on file.  Follow-up after tests   History of Present Illness: Glenn Rodriguez is a 40 y.o. male who presents for follow-up after testing   Previously, patient presented for Evaluation of a myriad of symptoms: Shortness of breath, palpitations, chest tightness.  Patient first noted symptoms several months ago. He was doing his usual workout chest and upper extremity exercises. He was doing a series of lifts of approximately 65 pounds. He was doing multiple sets of 20s. He noticed that he was starting to feel lightheaded vision turning dark. He also had onset of rapid heart rate that was associated with the shortness of breath and chest tightness. The symptoms are moderate to severe in intensity, lasted at the tail end of his exercise and for a few minutes after that. He was thinking that if he started running and take deep breaths, this may help with his symptoms. He ran for half a mile and then walked another half a mile after that. After this, he started having lightheadedness again together with mild chest tightness and palpitations.  Another episode while at church. He had been standing up for approximately 10 minutes when he started feeling lightheaded and faint. He noticed his heart was racing, this was associated with chest tightness and shortness of breath. His symptoms were severe and he ended up having them call EMS. When EMS came, his blood pressure was noted to be 180/110 with heart rates in the 140s to 150s. When he got to the emergency room, his blood pressure was 102 systolic. His heart rate had improved by then. It was thought that his symptoms may be related to anxiety or panic attacks. He has a history of this. But it was thought that he may need to be ruled out  for any cardiac issue.  His PCP advised him to stop drinking his Monster energy drinks. He continues to drink his one serving of coffee in the morning. This is equivalent to a grande serving, very strong coffee. He also continues to drink one to 3 beers in the evenings.  Since previous visit, patient has had no recurrence of those symptoms. His blood pressure has been in the normal range. He has stopped drinking the energy drinks and drinking very minimal coffee. Patient denies recurrence of chest pain, shortness of breath.  No true syncope. No PND, orthopnea, edema. No fever, cough, colds, abdominal pain.  ROS:  Please see the history of present illness. Aside from mentioned under HPI, all other systems are reviewed and negative.     Past Medical History:  Diagnosis Date  . Allergic rhinitis   . Depression   . History of genital warts   . Situational stress     Past Surgical History:  Procedure Laterality Date  . NO PAST SURGERIES       reports that he has never smoked. He has never used smokeless tobacco. He reports that he drinks alcohol. He reports that he does not use drugs. He quit smoking 14 years ago. He quit chewing tobacco for 25 years ago.  family history includes Depression in his mother. There is history of arrhythmia in the mother and maternal aunt and maternal grandmother. No known CAD.  Outpatient Medications Prior to Visit  Medication Sig Dispense Refill  . albuterol (PROVENTIL HFA;VENTOLIN HFA) 108 (90 Base) MCG/ACT inhaler Inhale 2 puffs into the lungs every 6 (six) hours as needed for wheezing or shortness of breath.    . clonazePAM (KLONOPIN) 0.5 MG tablet TAKE 1 TABLET BY MOUTH TWICE DAILY AS NEEDED FOR ANXIETY AND STRESS 28 tablet 1  . Imiquimod 3.75 % CREA Apply 1 application topically 3 (three) times a week. 3 x week at bedtime for up to 16 weeks. Wash off after 8 hours. 7.5 each 1   No facility-administered medications prior to visit.      Allergies:  Amoxicillin and Anesthetics, halogenated    PHYSICAL EXAM: VS:  BP 100/80 (BP Location: Left Arm, Patient Position: Sitting, Cuff Size: Normal)   Pulse (!) 58   Ht 5' 10"  (1.778 m)   Wt 176 lb 6.4 oz (80 kg)   BMI 25.31 kg/m  , Body mass index is 25.31 kg/m. Wt Readings from Last 3 Encounters:  12/11/15 176 lb 6.4 oz (80 kg)  10/23/15 176 lb 12.8 oz (80.2 kg)  10/19/15 178 lb 9.6 oz (81 kg)    GENERAL:  well developed, well nourished, not in acute distress HEENT: normocephalic, pink conjunctivae, anicteric sclerae, no xanthelasma, normal dentition, oropharynx clear NECK:  no neck vein engorgement, JVP normal, no hepatojugular reflux, carotid upstroke brisk and symmetric, no bruit, no thyromegaly, no lymphadenopathy LUNGS:  good respiratory effort, clear to auscultation bilaterally CV:  PMI not displaced, no thrills, no lifts, S1 and S2 within normal limits, no palpable S3 or S4, no murmurs, no rubs, no gallops ABD:  Soft, nontender, nondistended, normoactive bowel sounds, no abdominal aortic bruit, no hepatomegaly, no splenomegaly MS: nontender back, no kyphosis, no scoliosis, no joint deformities EXT:  2+ DP/PT pulses, no edema, no varicosities, no cyanosis, no clubbing SKIN: warm, nondiaphoretic, normal turgor, no ulcers NEUROPSYCH: alert, oriented to person, place, and time, sensory/motor grossly intact, normal mood, appropriate affect  Recent Labs: 10/14/2015: BUN 20; Creatinine, Ser 1.16; Hemoglobin 15.3; Platelets 309; Potassium 4.0; Sodium 138 10/19/2015: TSH 0.578   Lipid Panel No results found for: CHOL, TRIG, HDL, CHOLHDL, VLDL, LDLCALC, LDLDIRECT   Other studies Reviewed:  EKG:  The ekg from 10/14/2015, and the ER was personally reviewed by me and it revealed sinus rhythm, 71 bpm.  EKG from 10/23/2015 was personally reviewed by me and it revealed sinus rhythm, 64 BPM.  Additional studies/ records that were reviewed personally reviewed by me today include:  Echo  11/27/2015: Left ventricle: The cavity size was normal. Wall thickness was   normal. Systolic function was normal. The estimated ejection   fraction was in the range of 55% to 60%. Wall motion was normal;   there were no regional wall motion abnormalities. Left   ventricular diastolic function parameters were normal. - Tricuspid valve: There was mild regurgitation. - Pulmonary arteries: Systolic pressure was within the normal   range.  Stress echo 11/27/2015: Stress: There was a normal resting blood pressure with an   appropriate response to stress. Functional capacity was normal. - Stress ECG conclusions: There were no stress arrhythmias or   conduction abnormalities. The stress ECG was negative for   ischemia. - Staged echo: Normal echo stress The patient exercised for 10 min 5 sec, to protocol stage 4, to a maximal work rate of 11.8 mets. Patient reached 11% of maximum predicted heart rate.  Event monitor 12/07/2015: Monitoring period was 11/06/2015 to 12/05/2015 Baseline sample showed sinus rhythm, 88  BPM.  Automatically detected events: Sinus rhythm, sinus tachycardia maximum heart rate 124 BPM, sinus arrhythmia  Manually detected events: Symptoms where rapid heart beat, shortness of breath, lightheadedness. These correlated with sinus rhythm, sinus rhythm with artifact, sinus tachycardia maximum heart rate of 124 BPM.  No detected atrial fibrillation.  ASSESSMENT AND PLAN: Paplpitations that leads to shortness of breath and chest tightness. Episodes of lightheadedness  Findings of several test discussed with patient at length. Normal LV ejection fraction. No evidence of ischemia on stress echocardiogram. Good functional capacity. No evidence of significant arrhythmia noted on event monitor. No atrial fibrillation. Risk for any cardiac process is low. Likely, his symptoms may have been related to relative dehydration, use of energy drinks, increased caffeine  intake. Patient verbalized understanding. He continues to avoid energy drinks and limits his caffeine intake.   Current medicines are reviewed at length with the patient today.  The patient does not have concerns regarding medicines.  Labs/ tests ordered today include:  No orders of the defined types were placed in this encounter.   I had a lengthy and detailed discussion with the patient regarding diagnoses, prognosis, diagnostic options, treatment options , and side effects of medications.   I counseled the patient on importance of lifestyle modification including heart healthy diet, regular physical activity.   Disposition:   FU with undersigned prn  I spent at least 25 minutes with the patient today and more than 50% of the time was spent counseling the patient and coordinating care.    Signed, Wende Bushy, MD  12/12/2015 11:59 AM    [place]  This note was generated in part with voice recognition software and I apologize for any typographical errors that were not detected and corrected.

## 2015-12-14 ENCOUNTER — Other Ambulatory Visit: Payer: Self-pay | Admitting: Family Medicine

## 2015-12-18 NOTE — Telephone Encounter (Signed)
Prescription has been called into pharmacy. KW 

## 2016-02-16 ENCOUNTER — Other Ambulatory Visit: Payer: Self-pay | Admitting: Family Medicine

## 2016-02-19 ENCOUNTER — Other Ambulatory Visit: Payer: Self-pay | Admitting: Family Medicine

## 2016-02-19 NOTE — Telephone Encounter (Signed)
Prescription has been called into pharmacy. KW 

## 2016-03-10 ENCOUNTER — Ambulatory Visit (INDEPENDENT_AMBULATORY_CARE_PROVIDER_SITE_OTHER): Payer: BLUE CROSS/BLUE SHIELD | Admitting: Family Medicine

## 2016-03-10 ENCOUNTER — Encounter: Payer: Self-pay | Admitting: Family Medicine

## 2016-03-10 VITALS — BP 104/68 | HR 94 | Temp 98.8°F | Resp 17 | Wt 179.4 lb

## 2016-03-10 DIAGNOSIS — B349 Viral infection, unspecified: Secondary | ICD-10-CM | POA: Diagnosis not present

## 2016-03-10 MED ORDER — HYDROCODONE-HOMATROPINE 5-1.5 MG/5ML PO SYRP: ORAL_SOLUTION | ORAL | 0 refills | Status: DC

## 2016-03-10 NOTE — Patient Instructions (Signed)
Continue Mucinex D and stay out of work. Call me for new symptoms or not improving.

## 2016-03-10 NOTE — Progress Notes (Signed)
Subjective:     Patient ID: Glenn Rodriguez, male   DOB: 1975-05-22, 41 y.o.   MRN: [id number]  HPI  Chief Complaint  Patient presents with  . Cough    Patient comes in office today with concerns of cough and congestion since 02/19/16. Patient states that he had yellow/greenish like drainage, he had signed in to [facility] and was prescribed a z-pack according to patient on 02/29/16. Patient states that he has had no improvement since completing antibiotic and has run a fever high of 102. Patient reports taking otc Tylenol for fever reducer  States sinus drainage has cleared in color but remains congested. Reports onset in the last 48 hours of chest burning, body aches, dry cough, and fever to 102. + flu shot. States he continues to work at [employer] and is exposed to sick customers. Daughter and son are not ill   Review of Systems     Objective:   Physical Exam  Constitutional: He appears well-developed and well-nourished. He has a sickly appearance. No distress.  Ears: T.M's intact without inflammation Throat: no tonsillar enlargement or exudate Neck: no cervical adenopathy Lungs: clear     Assessment:    1. Acute viral syndrome: suspect flu - HYDROcodone-homatropine (HYCODAN) 5-1.5 MG/5ML syrup; 5 ml 4-6 hours as needed for cough  Dispense: 240 mL; Refill: 0    Plan:    Continue Mucinex D. Work excuse for 1/15-1/20/18

## 2016-08-04 ENCOUNTER — Other Ambulatory Visit: Payer: Self-pay | Admitting: Family Medicine

## 2016-08-04 NOTE — Telephone Encounter (Signed)
Prescription for Clonazepam has been called into pharmacy. KW 

## 2017-01-01 DIAGNOSIS — A630 Anogenital (venereal) warts: Secondary | ICD-10-CM | POA: Diagnosis not present

## 2017-01-24 ENCOUNTER — Other Ambulatory Visit: Payer: Self-pay | Admitting: Family Medicine

## 2017-01-26 NOTE — Telephone Encounter (Signed)
Prescription has been called in.KW 

## 2017-02-22 ENCOUNTER — Other Ambulatory Visit: Payer: Self-pay | Admitting: Family Medicine

## 2017-02-23 NOTE — Telephone Encounter (Signed)
Called in Rx as below.  

## 2017-03-23 ENCOUNTER — Other Ambulatory Visit: Payer: Self-pay | Admitting: Family Medicine

## 2017-03-23 NOTE — Telephone Encounter (Signed)
May call in clonazepam as below but inform Tawanna Coolerodd it is time for an office visit.

## 2017-03-23 NOTE — Telephone Encounter (Signed)
Prescription has been called into [facility].KW

## 2017-03-27 ENCOUNTER — Ambulatory Visit: Payer: BLUE CROSS/BLUE SHIELD | Admitting: Family Medicine

## 2017-03-27 ENCOUNTER — Encounter: Payer: Self-pay | Admitting: Family Medicine

## 2017-03-27 VITALS — BP 122/86 | HR 78 | Temp 98.9°F | Resp 16 | Wt 180.2 lb

## 2017-03-27 DIAGNOSIS — F411 Generalized anxiety disorder: Secondary | ICD-10-CM | POA: Diagnosis not present

## 2017-03-27 DIAGNOSIS — J069 Acute upper respiratory infection, unspecified: Secondary | ICD-10-CM

## 2017-03-27 MED ORDER — CLONAZEPAM 0.5 MG PO TABS: 0.5000 mg | ORAL_TABLET | Freq: Every day | ORAL | 5 refills | Status: DC | PRN

## 2017-03-27 NOTE — Progress Notes (Signed)
Subjective:     Patient ID: Glenn Rodriguez, male   DOB: April 30, 1975, 42 y.o.   MRN: [id number] Chief Complaint  Patient presents with  . Anxiety    Patient comes in office today for follow up, patient was last seen in office 10/19/15 and was advised to continue Clonazepam qd. Patient reports good compliance, tolerance and symptom control.   . Cough    Patient reports cough and congestion for the past 4 days. Patient complains of the following; sinus drainage, ear pressure and productive cough he has been taking otc Mucinex for relief.    HPI States his son who accompanies him has been sick as well. Reports taking clonazepam once in the AM usually keeps anxiety under control. He may be adopting his 40 year old niece in the future. Working for a promotion at [employer].  Review of Systems     Objective:   Physical Exam  Constitutional: He appears well-developed and well-nourished. No distress.  Ears: T.M's intact without inflammation Throat: no tonsillar enlargement or exudate Neck: no cervical adenopathy Lungs: clear     Assessment:    1. Viral upper respiratory tract infection  2. Generalized anxiety disorder - clonazePAM (KLONOPIN) 0.5 MG tablet; Take 1 tablet (0.5 mg total) by mouth daily as needed. for anxiety  Dispense: 30 tablet; Refill: 5    Plan:    Discussed continued use of Mucinex D and Delsym for cough.

## 2017-03-27 NOTE — Patient Instructions (Signed)
Discussed use of Mucinex D and Delsym for cough. 

## 2017-08-03 ENCOUNTER — Ambulatory Visit (INDEPENDENT_AMBULATORY_CARE_PROVIDER_SITE_OTHER): Payer: BLUE CROSS/BLUE SHIELD | Admitting: Family Medicine

## 2017-08-03 ENCOUNTER — Encounter: Payer: Self-pay | Admitting: Family Medicine

## 2017-08-03 VITALS — BP 120/76 | HR 68 | Temp 98.5°F | Resp 16 | Wt 179.0 lb

## 2017-08-03 DIAGNOSIS — B349 Viral infection, unspecified: Secondary | ICD-10-CM | POA: Diagnosis not present

## 2017-08-03 NOTE — Patient Instructions (Signed)
Discussed use of Mucinex D for congestion and Delsym for cough. 

## 2017-08-03 NOTE — Progress Notes (Signed)
  Subjective:     Patient ID: Gaynelle ArabianJustin D Scheu, male   DOB: [birth year], 42 y.o.   MRN: [id number] Chief Complaint  Patient presents with  . URI    Started yesterday (08/02/2017).  . Fever    Temp 102 last night, body aches, headache.   HPI States he has been taking Mucinex D and Goody's powders for his sx which also include sore throat and sinus congestion. 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  Review of Systems     Objective:   Physical Exam  Constitutional: He appears well-developed and well-nourished. No distress.  Ears: T.M's intact without inflammation Throat: no tonsillar enlargement or exudate/ + posterior pharyngeal erythema Neck: bilateral anterior cervical tenderness Lungs: clear     Assessment:    1. Acute viral syndrome     Plan:    Discussed sx rx. Work excuse for the week. He will call if new sx or not improving over the course of the week.

## 2017-09-25 ENCOUNTER — Ambulatory Visit: Payer: BLUE CROSS/BLUE SHIELD | Admitting: Physician Assistant

## 2017-09-25 ENCOUNTER — Encounter: Payer: Self-pay | Admitting: Physician Assistant

## 2017-09-25 VITALS — BP 118/84 | HR 80 | Temp 98.5°F | Wt 176.0 lb

## 2017-09-25 DIAGNOSIS — R21 Rash and other nonspecific skin eruption: Secondary | ICD-10-CM

## 2017-09-25 MED ORDER — DOXYCYCLINE HYCLATE 100 MG PO TABS
100.0000 mg | ORAL_TABLET | Freq: Two times a day (BID) | ORAL | 0 refills | Status: AC
Start: 2017-09-25 — End: 2017-10-05

## 2017-09-25 NOTE — Patient Instructions (Signed)

## 2017-09-25 NOTE — Progress Notes (Signed)
Patient: Glenn Rodriguez Male    DOB: 19-Jun-1975   42 y.o.   MRN: [id number] Visit Date: 09/25/2017  Today's Provider: Trey SailorsAdriana M Pollak, PA-C   Chief Complaint  Patient presents with  . Insect Bite   Subjective:    HPI   Reports he was working on his porch and noticed a lesion on his right shin, thinks it's a spider bite from three days ago. Has used some triamcinelone cream on it which he felt made it worse. Benadryl has helped with itching. He shows picture from yesterday on his phone which shows targetoid distribution of erythema. Otherwise no fever, chills, joint pain, nausea, vomiting.       Allergies  Allergen Reactions  . Amoxicillin Other (See Comments)    Childhood reaction-unknown  . Anesthetics, Halogenated Other (See Comments)    Unknown reaction     Current Outpatient Medications:  .  clonazePAM (KLONOPIN) 0.5 MG tablet, Take 1 tablet (0.5 mg total) by mouth daily as needed. for anxiety, Disp: 30 tablet, Rfl: 5 .  Imiquimod 3.75 % CREA, Apply 1 application topically 3 (three) times a week. 3 x week at bedtime for up to 16 weeks. Wash off after 8 hours. (Patient not taking: Reported on 08/03/2017), Disp: 7.5 each, Rfl: 1  Review of Systems  Constitutional: Negative.   Musculoskeletal: Positive for myalgias. Negative for arthralgias, back pain, gait problem, joint swelling, neck pain and neck stiffness.  Skin: Negative for color change, pallor, rash and wound.  Neurological: Negative for dizziness, light-headedness and headaches.    Social History   Tobacco Use  . Smoking status: Never Smoker  . Smokeless tobacco: Never Used  Substance Use Topics  . Alcohol use: Yes    Alcohol/week: 0.0 oz    Comment: occasional    Objective:   BP 118/84 (BP Location: Right Arm, Patient Position: Sitting, Cuff Size: Normal)   Pulse 80   Temp 98.5 F (36.9 C) (Oral)   Wt 176 lb (79.8 kg)   SpO2 99%   BMI 25.25 kg/m  Vitals:   09/25/17 0902  BP: 118/84  Pulse:  80  Temp: 98.5 F (36.9 C)  TempSrc: Oral  SpO2: 99%  Weight: 176 lb (79.8 kg)     Physical Exam  Constitutional: He is oriented to person, place, and time. He appears well-developed and well-nourished.  Neurological: He is alert and oriented to person, place, and time.  Skin: Skin is warm and dry. Rash noted.           Assessment & Plan:     1. Skin rash  Picture that patient provides on phone from yesterday does appear to be a targetoid rash. Will treat for tickborne illness as below.   - doxycycline (VIBRA-TABS) 100 MG tablet; Take 1 tablet (100 mg total) by mouth 2 (two) times daily for 10 days.  Dispense: 20 tablet; Refill: 0  2. Rash  - doxycycline (VIBRA-TABS) 100 MG tablet; Take 1 tablet (100 mg total) by mouth 2 (two) times daily for 10 days.  Dispense: 20 tablet; Refill: 0  Return if symptoms worsen or fail to improve.  The entirety of the information documented in the History of Present Illness, Review of Systems and Physical Exam were personally obtained by me. Portions of this information were initially documented by Kavin LeechLaura Walsh, CMA and reviewed by me for thoroughness and accuracy.        Trey SailorsAdriana M Pollak, PA-C  [facility]  [facility]  

## 2017-09-28 ENCOUNTER — Telehealth: Payer: Self-pay | Admitting: Physician Assistant

## 2017-09-28 NOTE — Telephone Encounter (Signed)
Patient called stating the doxycycline  you gave him last week is tearing his stomach up.   He wants to know if he should continue this or d/c/

## 2017-09-29 NOTE — Telephone Encounter (Signed)
Advised patient as below. Patient reports that he took ioperamide along with the medication and it seemed to help his symptoms. He reports that he will complete his dose.

## 2017-09-29 NOTE — Telephone Encounter (Signed)
He can take it with a little bland food if it is upsetting. If he absolutely needs to stop it, we can get titers to check if there certainly was a tickborne illness and determine need for continued treatment.

## 2017-09-29 NOTE — Telephone Encounter (Signed)
LMTCB 09/29/2017   Thanks,   -Keiarra Charon  

## 2017-09-30 NOTE — Telephone Encounter (Signed)
Noted, he does need to be careful with anti-diarrheals and risk of infection from antibiotic. I would try to take them as little as possible and if he is having more than three episodes of diarrhea in 24 hour period that are loose and watery he should call the clinic.

## 2017-10-08 ENCOUNTER — Other Ambulatory Visit: Payer: Self-pay | Admitting: Family Medicine

## 2017-10-08 DIAGNOSIS — F411 Generalized anxiety disorder: Secondary | ICD-10-CM

## 2017-11-26 ENCOUNTER — Telehealth: Payer: Self-pay | Admitting: Physician Assistant

## 2018-02-12 ENCOUNTER — Other Ambulatory Visit: Payer: Self-pay | Admitting: Family Medicine

## 2018-02-12 DIAGNOSIS — F411 Generalized anxiety disorder: Secondary | ICD-10-CM

## 2018-04-02 ENCOUNTER — Ambulatory Visit (INDEPENDENT_AMBULATORY_CARE_PROVIDER_SITE_OTHER): Payer: 59 | Admitting: Physician Assistant

## 2018-04-02 ENCOUNTER — Encounter: Payer: Self-pay | Admitting: Physician Assistant

## 2018-04-02 VITALS — BP 132/82 | HR 67 | Temp 98.9°F | Resp 16 | Wt 176.0 lb

## 2018-04-02 DIAGNOSIS — B9789 Other viral agents as the cause of diseases classified elsewhere: Secondary | ICD-10-CM

## 2018-04-02 DIAGNOSIS — J069 Acute upper respiratory infection, unspecified: Secondary | ICD-10-CM | POA: Diagnosis not present

## 2018-04-02 LAB — POCT INFLUENZA A/B
Influenza A, POC: NEGATIVE
Influenza B, POC: NEGATIVE

## 2018-04-02 NOTE — Patient Instructions (Signed)
Viral Respiratory Infection  A viral respiratory infection is an illness that affects parts of the body that are used for breathing. These include the lungs, nose, and throat. It is caused by a germ called a virus.  Some examples of this kind of infection are:  · A cold.  · The flu (influenza).  · A respiratory syncytial virus (RSV) infection.  A person who gets this illness may have the following symptoms:  · A stuffy or runny nose.  · Yellow or green fluid in the nose.  · A cough.  · Sneezing.  · Tiredness (fatigue).  · Achy muscles.  · A sore throat.  · Sweating or chills.  · A fever.  · A headache.  Follow these instructions at home:  Managing pain and congestion  · Take over-the-counter and prescription medicines only as told by your doctor.  · If you have a sore throat, gargle with salt water. Do this 3-4 times per day or as needed. To make a salt-water mixture, dissolve ½-1 tsp of salt in 1 cup of warm water. Make sure that all the salt dissolves.  · Use nose drops made from salt water. This helps with stuffiness (congestion). It also helps soften the skin around your nose.  · Drink enough fluid to keep your pee (urine) pale yellow.  General instructions    · Rest as much as possible.  · Do not drink alcohol.  · Do not use any products that have nicotine or tobacco, such as cigarettes and e-cigarettes. If you need help quitting, ask your doctor.  · Keep all follow-up visits as told by your doctor. This is important.  How is this prevented?    · Get a flu shot every year. Ask your doctor when you should get your flu shot.  · Do not let other people get your germs. If you are sick:  ? Stay home from work or school.  ? Wash your hands with soap and water often. Wash your hands after you cough or sneeze. If soap and water are not available, use hand sanitizer.  · Avoid contact with people who are sick during cold and flu season. This is in fall and winter.  Get help if:  · Your symptoms last for 10 days or  longer.  · Your symptoms get worse over time.  · You have a fever.  · You have very bad pain in your face or forehead.  · Parts of your jaw or neck become very swollen.  Get help right away if:  · You feel pain or pressure in your chest.  · You have shortness of breath.  · You faint or feel like you will faint.  · You keep throwing up (vomiting).  · You feel confused.  Summary  · A viral respiratory infection is an illness that affects parts of the body that are used for breathing.  · Examples of this illness include a cold, the flu, and respiratory syncytial virus (RSV) infection.  · The infection can cause a runny nose, cough, sneezing, sore throat, and fever.  · Follow what your doctor tells you about taking medicines, drinking lots of fluid, washing your hands, resting at home, and avoiding people who are sick.  This information is not intended to replace advice given to you by your health care provider. Make sure you discuss any questions you have with your health care provider.  Document Released: 01/24/2008 Document Revised: 03/23/2017 Document Reviewed: 03/23/2017  Elsevier   Interactive Patient Education © 2019 Elsevier Inc.

## 2018-04-02 NOTE — Progress Notes (Signed)
Patient: Glenn Rodriguez Male    DOB: 1975-03-06   42 y.o.   MRN: [id number] Visit Date: 04/02/2018  Today's Provider: Trey Sailors, PA-C   Chief Complaint  Patient presents with  . URI   Subjective:     URI   This is a new problem. The current episode started yesterday. The problem has been gradually worsening. There has been no fever (felt feverish). Associated symptoms include congestion, coughing, headaches and sinus pain. He has tried nothing for the symptoms.    Allergies  Allergen Reactions  . Amoxicillin Other (See Comments)    Childhood reaction-unknown  . Anesthetics, Halogenated Other (See Comments)    Unknown reaction     Current Outpatient Medications:  .  clonazePAM (KLONOPIN) 0.5 MG tablet, TAKE 1 TABLET BY MOUTH DAILY AS NEEDED FOR ANXIETY, Disp: 30 tablet, Rfl: 5 .  Imiquimod 3.75 % CREA, Apply 1 application topically 3 (three) times a week. 3 x week at bedtime for up to 16 weeks. Wash off after 8 hours. (Patient not taking: Reported on 04/02/2018), Disp: 7.5 each, Rfl: 1  Review of Systems  HENT: Positive for congestion and sinus pain.   Respiratory: Positive for cough.   Neurological: Positive for headaches.    Social History   Tobacco Use  . Smoking status: Never Smoker  . Smokeless tobacco: Never Used  Substance Use Topics  . Alcohol use: Yes    Alcohol/week: 0.0 standard drinks    Comment: occasional       Objective:   BP 132/82 (BP Location: Left Arm, Patient Position: Sitting, Cuff Size: Normal)   Pulse 67   Temp 98.9 F (37.2 C)   Resp 16   Wt 176 lb (79.8 kg)   SpO2 97%   BMI 25.25 kg/m  Vitals:   04/02/18 0955  BP: 132/82  Pulse: 67  Resp: 16  Temp: 98.9 F (37.2 C)  SpO2: 97%  Weight: 176 lb (79.8 kg)     Physical Exam Constitutional:      General: He is not in acute distress.    Appearance: He is well-developed. He is not diaphoretic.  HENT:     Right Ear: Tympanic membrane and external ear normal.   Left Ear: Tympanic membrane and external ear normal.     Nose: Rhinorrhea present.     Right Sinus: No maxillary sinus tenderness or frontal sinus tenderness.     Left Sinus: No maxillary sinus tenderness or frontal sinus tenderness.     Mouth/Throat:     Pharynx: Uvula midline. No oropharyngeal exudate.  Eyes:     General:        Right eye: Discharge present.        Left eye: Discharge present.    Conjunctiva/sclera: Conjunctivae normal.     Comments: Watery Discharge   Neck:     Musculoskeletal: Normal range of motion and neck supple.  Cardiovascular:     Rate and Rhythm: Normal rate and regular rhythm.  Pulmonary:     Effort: Pulmonary effort is normal. No respiratory distress.     Breath sounds: Normal breath sounds. No wheezing or rales.  Lymphadenopathy:     Cervical: No cervical adenopathy.  Skin:    General: Skin is warm and dry.  Neurological:     Mental Status: He is alert and oriented to person, place, and time.  Psychiatric:        Behavior: Behavior normal.  Assessment & Plan    1. Viral URI with cough  Rapid flu negative. Counseled regarding signs and symptoms of viral and bacterial respiratory infections. Advised to call or return for additional evaluation if he develops any sign of bacterial infection, or if current symptoms last longer than 10 days.   - POCT Influenza A/B       Trey Sailors, PA-C  [facility]

## 2018-05-31 ENCOUNTER — Encounter: Payer: Self-pay | Admitting: Physician Assistant

## 2018-05-31 ENCOUNTER — Ambulatory Visit (INDEPENDENT_AMBULATORY_CARE_PROVIDER_SITE_OTHER): Payer: 59 | Admitting: Physician Assistant

## 2018-05-31 VITALS — Ht 70.0 in | Wt 178.0 lb

## 2018-05-31 DIAGNOSIS — R059 Cough, unspecified: Secondary | ICD-10-CM

## 2018-05-31 DIAGNOSIS — R05 Cough: Secondary | ICD-10-CM

## 2018-05-31 DIAGNOSIS — Z20828 Contact with and (suspected) exposure to other viral communicable diseases: Secondary | ICD-10-CM

## 2018-05-31 DIAGNOSIS — Z20822 Contact with and (suspected) exposure to covid-19: Secondary | ICD-10-CM

## 2018-05-31 NOTE — Progress Notes (Signed)
Patient: Glenn Rodriguez Male    DOB: 1975-06-25   43 y.o.   MRN: [id number] Visit Date: 05/31/2018  Today's Provider: Trey SailorsAdriana M Pollak, PA-C   Chief Complaint  Patient presents with  . URI   Subjective:    Virtual Visit via Telephone Note  I connected with Glenn ArabianJustin D Papa on 05/31/18 at  1:40 PM EDT by telephone and verified that I am speaking with the correct person using two identifiers.   I discussed the limitations, risks, security and privacy concerns of performing an evaluation and management service by telephone and the availability of in person appointments. I also discussed with the patient that there may be a patient responsible charge related to this service. The patient expressed understanding and agreed to proceed.  HPI Upper Respiratory Infection: Patient complains of symptoms of a URI. Symptoms include cough and fever. Onset of symptoms was 3 days ago, stable since that time. He also c/o fever 99.50F for the past 1 day. He denies shortness of breath constantly, occasionally feels winded. No wheezing or other lung sounds that he can hear. Patient has son in daycare and works as Pharmacologistpharmacy technician in [employer], wife works at [employer]. Last worked on Friday, not wearing masks at work. Seeing customers to pick up prescriptions. Has had three positive COVID-19 patients in the pharmacy  He is drinking plenty of fluids. Evaluation to date: none. Treatment to date: Tylenol. Son is currently at pediatrics and receiving nasal swab, results of which should come back in 24 hours.     Allergies  Allergen Reactions  . Amoxicillin Other (See Comments)    Childhood reaction-unknown  . Anesthetics, Halogenated Other (See Comments)    Unknown reaction     Current Outpatient Medications:  .  clonazePAM (KLONOPIN) 0.5 MG tablet, TAKE 1 TABLET BY MOUTH DAILY AS NEEDED FOR ANXIETY, Disp: 30 tablet, Rfl: 5  Review of Systems  Constitutional: Negative.   HENT: Positive for  congestion.   Respiratory: Positive for cough.     Social History   Tobacco Use  . Smoking status: Never Smoker  . Smokeless tobacco: Never Used  Substance Use Topics  . Alcohol use: Yes    Alcohol/week: 0.0 standard drinks    Comment: occasional       Objective:   Ht 5\' 10"  (1.778 m)   Wt 178 lb (80.7 kg)   BMI 25.54 kg/m  Vitals:   05/31/18 1347  Weight: 178 lb (80.7 kg)  Height: 5\' 10"  (1.778 m)     Physical Exam Pulmonary:     Effort: Pulmonary effort is normal. No respiratory distress.  Neurological:     Mental Status: He is alert.  Psychiatric:        Mood and Affect: Mood normal.        Behavior: Behavior normal.         Assessment & Plan    1. Cough  Due to symptoms and high risk exposure working as a Pharmacologistpharmacy technician in [employer], will have him stay home x 2 weeks. Counseled on sx treatment including delsym for cough, albuterol inhaler for wheezing. Counseled on return precautions and when to seek emergency treatment. Will send letter for work through [employer]. Advised to quarantine at home, which he has already been doing.   2. Exposure to Covid-19 Virus   The entirety of the information documented in the History of Present Illness, Review of Systems and Physical Exam were personally obtained by me.  Portions of this information were initially documented by Rondel Baton, CMA and reviewed by me for thoroughness and accuracy.   F/u PRN.      Trey Sailors, PA-C  [facility]

## 2018-05-31 NOTE — Patient Instructions (Signed)
This information is directly available on the CDC website: https://www.cdc.gov/coronavirus/2019-ncov/if-you-are-sick/steps-when-sick.html    Source:CDC Reference to specific commercial products, manufacturers, companies, or trademarks does not constitute its endorsement or recommendation by the U.S. Government, Department of Health and Human Services, or Centers for Disease Control and Prevention.  

## 2018-06-01 ENCOUNTER — Telehealth: Payer: Self-pay | Admitting: Family Medicine

## 2018-06-01 DIAGNOSIS — R062 Wheezing: Secondary | ICD-10-CM

## 2018-06-01 MED ORDER — ALBUTEROL SULFATE HFA 108 (90 BASE) MCG/ACT IN AERS: 2.0000 | INHALATION_SPRAY | Freq: Four times a day (QID) | RESPIRATORY_TRACT | 0 refills | Status: DC | PRN

## 2018-06-01 NOTE — Telephone Encounter (Signed)
Pt said he talk to [facility] yesterday on web x and he needs a refill on the Albuterol Inhaler  [facility]  [address]  CB#  [phone]  Thanks, Barth Kirks

## 2018-06-01 NOTE — Telephone Encounter (Signed)
Sent in albuterol

## 2018-06-04 ENCOUNTER — Encounter: Payer: Self-pay | Admitting: Physician Assistant

## 2018-06-05 ENCOUNTER — Telehealth: Payer: BLUE CROSS/BLUE SHIELD | Admitting: Nurse Practitioner

## 2018-06-05 DIAGNOSIS — R059 Cough, unspecified: Secondary | ICD-10-CM

## 2018-06-05 DIAGNOSIS — R05 Cough: Secondary | ICD-10-CM

## 2018-06-05 DIAGNOSIS — J189 Pneumonia, unspecified organism: Secondary | ICD-10-CM

## 2018-06-05 MED ORDER — AZITHROMYCIN 250 MG PO TABS: ORAL_TABLET | ORAL | 0 refills | Status: DC

## 2018-06-05 NOTE — Progress Notes (Signed)
We are sorry that you are not feeling well.  Here is how we plan to help!  Based on your presentation I believe you most likely have A cough due to bacteria.  When patients have a fever and a productive cough with a change in color or increased sputum production, we are concerned about bacterial bronchitis.  If left untreated it can progress to pneumonia.  If your symptoms do not improve with your treatment plan it is important that you contact your provider.   I have prescribed Azithromyin 250 mg: two tablets now and then one tablet daily for 4 additonal days    In addition you may use A non-prescription cough medication called Mucinex DM: take 2 tablets every 12 hours.  * If you become incresely short of breath then you need  To er at [facility] That is where they are treating respiratory infections.  From your responses in the eVisit questionnaire you describe inflammation in the upper respiratory tract which is causing a significant cough.  This is commonly called Bronchitis and has four common causes:    Allergies  Viral Infections  Acid Reflux  Bacterial Infection Allergies, viruses and acid reflux are treated by controlling symptoms or eliminating the cause. An example might be a cough caused by taking certain blood pressure medications. You stop the cough by changing the medication. Another example might be a cough caused by acid reflux. Controlling the reflux helps control the cough.  USE OF BRONCHODILATOR ("RESCUE") INHALERS: There is a risk from using your bronchodilator too frequently.  The risk is that over-reliance on a medication which only relaxes the muscles surrounding the breathing tubes can reduce the effectiveness of medications prescribed to reduce swelling and congestion of the tubes themselves.  Although you feel brief relief from the bronchodilator inhaler, your asthma may actually be worsening with the tubes becoming more swollen and filled with mucus.  This can delay  other crucial treatments, such as oral steroid medications. If you need to use a bronchodilator inhaler daily, several times per day, you should discuss this with your provider.  There are probably better treatments that could be used to keep your asthma under control.     HOME CARE . Only take medications as instructed by your medical team. . Complete the entire course of an antibiotic. . Drink plenty of fluids and get plenty of rest. . Avoid close contacts especially the very young and the elderly . Cover your mouth if you cough or cough into your sleeve. . Always remember to wash your hands . A steam or ultrasonic humidifier can help congestion.   GET HELP RIGHT AWAY IF: . You develop worsening fever. . You become short of breath . You cough up blood. . Your symptoms persist after you have completed your treatment plan MAKE SURE YOU   Understand these instructions.  Will watch your condition.  Will get help right away if you are not doing well or get worse.  Your e-visit answers were reviewed by a board certified advanced clinical practitioner to complete your personal care plan.  Depending on the condition, your plan could have included both over the counter or prescription medications. If there is a problem please reply  once you have received a response from your provider. Your safety is important to [place].  If you have drug allergies check your prescription carefully.    You can use MyChart to ask questions about today's visit, request a non-urgent call back, or ask  for a work or school excuse for 24 hours related to this e-Visit. If it has been greater than 24 hours you will need to follow up with your provider, or enter a new e-Visit to address those concerns. You will get an e-mail in the next two days asking about your experience.  I hope that your e-visit has been valuable and will speed your recovery. Thank you for using e-visits.  5 minutes spent reviewing and documenting in  chart.

## 2018-06-10 ENCOUNTER — Encounter: Payer: Self-pay | Admitting: Physician Assistant

## 2018-06-11 ENCOUNTER — Encounter: Payer: Self-pay | Admitting: Physician Assistant

## 2018-06-11 ENCOUNTER — Ambulatory Visit (INDEPENDENT_AMBULATORY_CARE_PROVIDER_SITE_OTHER): Payer: 59 | Admitting: Physician Assistant

## 2018-06-11 VITALS — BP 139/92 | HR 79 | Temp 98.6°F

## 2018-06-11 DIAGNOSIS — J069 Acute upper respiratory infection, unspecified: Secondary | ICD-10-CM

## 2018-06-11 NOTE — Progress Notes (Signed)
Patient: Glenn Rodriguez Male    DOB: 1976/01/11   42 y.o.   MRN: [id number] Visit Date: 06/15/2018  Today's Provider: Trey Sailors, PA-C   Chief Complaint  Patient presents with  . URI   Subjective:    Virtual Visit via Video Note  I connected with Glenn Rodriguez on 06/15/18 at 10:00 AM EDT by a video enabled telemedicine application and verified that I am speaking with the correct person using two identifiers.   I discussed the limitations of evaluation and management by telemedicine and the availability of in person appointments. The patient expressed understanding and agreed to proceed.  HPI Patient states that he needs a release to return to work. Was seen for URI on 05/31/2018, began having symptoms 05/28/2018. Was placed on 2 week leave from work due to presumed COVID. He reports he is feeling much better, no fever in the past 3 days, not using fever reducers. Ready to go back to work and needs note. Temperature today is 98.75F   Allergies  Allergen Reactions  . Amoxicillin Other (See Comments)    Childhood reaction-unknown  . Anesthetics, Halogenated Other (See Comments)    Unknown reaction     Current Outpatient Medications:  .  albuterol (PROVENTIL HFA;VENTOLIN HFA) 108 (90 Base) MCG/ACT inhaler, Inhale 2 puffs into the lungs every 6 (six) hours as needed for wheezing or shortness of breath., Disp: 1 Inhaler, Rfl: 0 .  clonazePAM (KLONOPIN) 0.5 MG tablet, TAKE 1 TABLET BY MOUTH DAILY AS NEEDED FOR ANXIETY, Disp: 30 tablet, Rfl: 5 .  azithromycin (ZITHROMAX Z-PAK) 250 MG tablet, As directed (Patient not taking: Reported on 06/11/2018), Disp: 6 tablet, Rfl: 0  Review of Systems  Respiratory: Positive for cough.   All other systems reviewed and are negative.   Social History   Tobacco Use  . Smoking status: Never Smoker  . Smokeless tobacco: Never Used  Substance Use Topics  . Alcohol use: Yes    Alcohol/week: 0.0 standard drinks    Comment: occasional       Objective:   BP (!) 139/92 (BP Location: Left Arm, Patient Position: Sitting, Cuff Size: Normal)   Pulse 79   Temp 98.6 F (37 C) (Oral)  Vitals:   06/11/18 0816  BP: (!) 139/92  Pulse: 79  Temp: 98.6 F (37 C)  TempSrc: Oral     Physical Exam Constitutional:      General: He is not in acute distress.    Appearance: Normal appearance. He is not ill-appearing.  Pulmonary:     Effort: Pulmonary effort is normal. No respiratory distress.  Neurological:     Mental Status: He is alert.  Psychiatric:        Mood and Affect: Mood normal.        Behavior: Behavior normal.         Assessment & Plan    1. Upper respiratory tract infection, unspecified type  Return to work note provided.  The entirety of the information documented in the History of Present Illness, Review of Systems and Physical Exam were personally obtained by me. Portions of this information were initially documented by Lexine Baton, LPN and reviewed by me for thoroughness and accuracy.   F/u PRN   Patient location: home Provider location: [facility]/home office  Persons involved in the visit: patient, provider        Trey Sailors, PA-C  [facility]

## 2018-06-15 NOTE — Patient Instructions (Signed)

## 2018-07-05 ENCOUNTER — Encounter: Payer: Self-pay | Admitting: Physician Assistant

## 2018-07-05 ENCOUNTER — Ambulatory Visit (INDEPENDENT_AMBULATORY_CARE_PROVIDER_SITE_OTHER): Payer: 59 | Admitting: Physician Assistant

## 2018-07-05 DIAGNOSIS — H1013 Acute atopic conjunctivitis, bilateral: Secondary | ICD-10-CM

## 2018-07-05 MED ORDER — AZELASTINE HCL 0.05 % OP SOLN: 1.0000 [drp] | Freq: Two times a day (BID) | OPHTHALMIC | 1 refills | Status: DC

## 2018-07-05 MED ORDER — MONTELUKAST SODIUM 10 MG PO TABS: 10.0000 mg | ORAL_TABLET | Freq: Every day | ORAL | 0 refills | Status: DC

## 2018-07-05 NOTE — Progress Notes (Signed)
Patient: Glenn Rodriguez Male    DOB: 1975-09-21   42 y.o.   MRN: [id number] Visit Date: 07/15/2018  Today's Provider: Trey Sailors, PA-C   Chief Complaint  Patient presents with  . eye irritation   Subjective:    I,Joseline E. Rosas,RMA am acting as a Neurosurgeon for Osvaldo Angst, PA-C.   HPI   Virtual Visit via Video Note  I connected with Glenn Rodriguez on 07/05/2018 at  3:20 PM EDT by a video enabled telemedicine application and verified that I am speaking with the correct person using two identifiers.   I discussed the limitations of evaluation and management by telemedicine and the availability of in person appointments. The patient expressed understanding and agreed to proceed.  Patient reports he has some redness, watering, and irritation of right eye. Also reports puffiness. Denies vision loss, change. Left eye is fine. He does wear glasses, has not worn contacts in > 1 week. Denies eye pain. Reports he was out mowing he lawn this weekend.   Allergies  Allergen Reactions  . Amoxicillin Other (See Comments)    Childhood reaction-unknown  . Anesthetics, Halogenated Other (See Comments)    Unknown reaction     Current Outpatient Medications:  .  albuterol (PROVENTIL HFA;VENTOLIN HFA) 108 (90 Base) MCG/ACT inhaler, Inhale 2 puffs into the lungs every 6 (six) hours as needed for wheezing or shortness of breath., Disp: 1 Inhaler, Rfl: 0 .  azelastine (OPTIVAR) 0.05 % ophthalmic solution, Place 1 drop into both eyes 2 (two) times daily., Disp: 6 mL, Rfl: 1 .  azithromycin (ZITHROMAX Z-PAK) 250 MG tablet, As directed (Patient not taking: Reported on 06/11/2018), Disp: 6 tablet, Rfl: 0 .  clonazePAM (KLONOPIN) 0.5 MG tablet, TAKE 1 TABLET BY MOUTH DAILY AS NEEDED FOR ANXIETY, Disp: 30 tablet, Rfl: 5 .  montelukast (SINGULAIR) 10 MG tablet, Take 1 tablet (10 mg total) by mouth at bedtime., Disp: 90 tablet, Rfl: 0  Review of Systems  Social History   Tobacco Use  .  Smoking status: Never Smoker  . Smokeless tobacco: Never Used  Substance Use Topics  . Alcohol use: Yes    Alcohol/week: 0.0 standard drinks    Comment: occasional       Objective:   There were no vitals taken for this visit. There were no vitals filed for this visit.   Physical Exam Constitutional:      Appearance: Normal appearance.  Eyes:     General: Gaze aligned appropriately.     Extraocular Movements: Extraocular movements intact.     Conjunctiva/sclera:     Right eye: Right conjunctiva is not injected. No exudate or hemorrhage.    Left eye: Left conjunctiva is not injected. No exudate or hemorrhage.    Comments: Right eye lids slightly puffy.   Neurological:     Mental Status: He is alert.         Assessment & Plan    1. Allergic conjunctivitis of both eyes  - montelukast (SINGULAIR) 10 MG tablet; Take 1 tablet (10 mg total) by mouth at bedtime.  Dispense: 90 tablet; Refill: 0 - azelastine (OPTIVAR) 0.05 % ophthalmic solution; Place 1 drop into both eyes 2 (two) times daily.  Dispense: 6 mL; Refill: 1  The entirety of the information documented in the History of Present Illness, Review of Systems and Physical Exam were personally obtained by me. Portions of this information were initially documented by Hetty Ely, CMA and reviewed  by me for thoroughness and accuracy.   F/u PRN.       Trey Sailors, PA-C  [facility]

## 2018-07-15 NOTE — Patient Instructions (Signed)
Allergic Conjunctivitis  A clear membrane (conjunctiva) covers the white part of your eye and the inner surface of your eyelid. Allergic conjunctivitis happens when this membrane has inflammation. This is caused by allergies. Common causes of allergic reactions (allergens)include:  · Outdoor allergens, such as:  ? Pollen.  ? Grass and weeds.  ? Mold spores.  · Indoor allergens, such as:  ? Dust.  ? Smoke.  ? Mold.  ? Pet dander.  ? Animal hair.  This condition can make your eye red or pink. It can also make your eye feel itchy. This condition cannot be spread from one person to another person (is not contagious).  Follow these instructions at home:  · Try not to be around things that you are allergic to.  · Take or apply over-the-counter and prescription medicines only as told by your doctor. These include any eye drops.  · Place a cool, clean washcloth on your eye for 10-20 minutes. Do this 3-4 times a day.  · Do not touch or rub your eyes.  · Do not wear contact lenses until the inflammation is gone. Wear glasses instead.  · Do not wear eye makeup until the inflammation is gone.  · Keep all follow-up visits as told by your doctor. This is important.  Contact a doctor if:  · Your symptoms get worse.  · Your symptoms do not get better with treatment.  · You have mild eye pain.  · You are sensitive to light,  · You have spots or blisters on your eyes.  · You have pus coming from your eye.  · You have a fever.  Get help right away if:  · You have redness, swelling, or other symptoms in only one eye.  · Your vision is blurry.  · You have vision changes.  · You have very bad eye pain.  Summary  · Allergic conjunctivitis is caused by allergies. It can make your eye red or pink, and it can make your eye feel itchy.  · This condition cannot be spread from one person to another person (is not contagious).  · Try not to be around things that you are allergic to.  · Take or apply over-the-counter and prescription medicines  only as told by your doctor. These include any eye drops.  · Contact your doctor if your symptoms get worse or they do not get better with treatment.  This information is not intended to replace advice given to you by your health care provider. Make sure you discuss any questions you have with your health care provider.  Document Released: 07/31/2009 Document Revised: 10/05/2015 Document Reviewed: 10/05/2015  Elsevier Interactive Patient Education © 2019 Elsevier Inc[employer]

## 2018-07-21 NOTE — Telephone Encounter (Signed)
Error

## 2018-07-28 ENCOUNTER — Encounter: Payer: Self-pay | Admitting: Physician Assistant

## 2018-07-28 ENCOUNTER — Other Ambulatory Visit: Payer: Self-pay | Admitting: Physician Assistant

## 2018-07-28 DIAGNOSIS — F411 Generalized anxiety disorder: Secondary | ICD-10-CM

## 2018-07-28 MED ORDER — CLONAZEPAM 0.5 MG PO TABS: 0.5000 mg | ORAL_TABLET | Freq: Every day | ORAL | 1 refills | Status: DC | PRN

## 2018-07-28 NOTE — Telephone Encounter (Signed)
[facility] in [place] faxed refill request for the following medications:  clonazePAM (KLONOPIN) 0.5 MG tablet   Please advise.

## 2018-09-08 ENCOUNTER — Encounter: Payer: Self-pay | Admitting: Family Medicine

## 2018-09-08 ENCOUNTER — Other Ambulatory Visit: Payer: Self-pay

## 2018-09-08 ENCOUNTER — Ambulatory Visit (INDEPENDENT_AMBULATORY_CARE_PROVIDER_SITE_OTHER): Payer: 59 | Admitting: Family Medicine

## 2018-09-08 VITALS — BP 144/98 | HR 80 | Temp 98.7°F | Resp 16 | Wt 181.0 lb

## 2018-09-08 DIAGNOSIS — Z8659 Personal history of other mental and behavioral disorders: Secondary | ICD-10-CM

## 2018-09-08 DIAGNOSIS — F411 Generalized anxiety disorder: Secondary | ICD-10-CM

## 2018-09-08 MED ORDER — CLONAZEPAM 0.5 MG PO TABS: 0.5000 mg | ORAL_TABLET | ORAL | 5 refills | Status: DC

## 2018-09-08 NOTE — Progress Notes (Signed)
Patient: Glenn Rodriguez Male    DOB: 1975/06/12   42 y.o.   MRN: [id number] Visit Date: 09/08/2018  Today's Provider: Mila Merryonald Braun Rocca, MD   Chief Complaint  Patient presents with  . Anxiety   Subjective:     Anxiety Presents for follow-up (Pt states he is doing well on current medications) visit. Patient reports no decreased concentration, depressed mood, dizziness, excessive worry, insomnia, malaise, nervous/anxious behavior, panic, restlessness or suicidal ideas. The quality of sleep is good.    Patient was previously seen by Glenn ArthursBob Chauvin, PA-C and here to establish with me today. He states he started having anxiety about 2012 when he was gong through divorce and took clonazepam just as need, but had ER visit can cardiac workup which was ultimately determined to be panic disorder a few years ago, at which time he started taking clonazepam once every morning and has had no similar events since. He did try venlafaxine and sertraline while he was going through divorce but stopped when he started having suicidal ideation. Denies feeling depressed now and has no other complaints at this time. .   Allergies  Allergen Reactions  . Amoxicillin Other (See Comments)    Childhood reaction-unknown  . Anesthetics, Halogenated Other (See Comments)    Unknown reaction     Current Outpatient Medications:  .  clonazePAM (KLONOPIN) 0.5 MG tablet, Take 1 tablet (0.5 mg total) by mouth daily as needed. for anxiety, Disp: 30 tablet, Rfl: 1 .  montelukast (SINGULAIR) 10 MG tablet, Take 1 tablet (10 mg total) by mouth at bedtime., Disp: 90 tablet, Rfl: 0 .  albuterol (PROVENTIL HFA;VENTOLIN HFA) 108 (90 Base) MCG/ACT inhaler, Inhale 2 puffs into the lungs every 6 (six) hours as needed for wheezing or shortness of breath. (Patient not taking: Reported on 09/08/2018), Disp: 1 Inhaler, Rfl: 0 .  azelastine (OPTIVAR) 0.05 % ophthalmic solution, Place 1 drop into both eyes 2 (two) times daily. (Patient not  taking: Reported on 09/08/2018), Disp: 6 mL, Rfl: 1 .  azithromycin (ZITHROMAX Z-PAK) 250 MG tablet, As directed (Patient not taking: Reported on 06/11/2018), Disp: 6 tablet, Rfl: 0  Review of Systems  Constitutional: Negative.   Respiratory: Negative.   Cardiovascular: Negative.   Gastrointestinal: Negative.   Allergic/Immunologic: Positive for environmental allergies.  Neurological: Negative for dizziness, light-headedness and headaches.  Psychiatric/Behavioral: Negative.  Negative for decreased concentration, self-injury, sleep disturbance and suicidal ideas. The patient is not nervous/anxious and does not have insomnia.     Social History   Tobacco Use  . Smoking status: Former Smoker    Quit date: 2002    Years since quitting: 18.5  . Smokeless tobacco: Never Used  . Tobacco comment: Remote: Quit when he was about 24  Substance Use Topics  . Alcohol use: Yes    Alcohol/week: 0.0 standard drinks    Comment: occasional       Objective:   BP (!) 144/98 (BP Location: Right Arm, Patient Position: Sitting, Cuff Size: Normal)   Pulse 80   Temp 98.7 F (37.1 C) (Oral)   Resp 16   Wt 181 lb (82.1 kg)   BMI 25.97 kg/m  Vitals:   09/08/18 0853  BP: (!) 144/98  Pulse: 80  Resp: 16  Temp: 98.7 F (37.1 C)  TempSrc: Oral  Weight: 181 lb (82.1 kg)     Physical Exam  General appearance: alert, well developed, well nourished, cooperative and in no distress Head: Normocephalic, without obvious  abnormality, atraumatic Respiratory: Respirations even and unlabored, normal respiratory rate Extremities: No gross deformities Skin: Skin color, texture, turgor normal. No rashes seen  Psych: Appropriate mood and affect. Neurologic: Mental status: Alert, oriented to person, place, and time, thought content appropriate.       Assessment & Plan    1. Generalized anxiety disorder  - clonazePAM (KLONOPIN) 0.5 MG tablet; Take 1 tablet (0.5 mg total) by mouth every morning.   Dispense: 30 tablet; Refill: 5  2. Panic disorder  - clonazePAM (KLONOPIN) 0.5 MG tablet; Take 1 tablet (0.5 mg total) by mouth every morning.  Dispense: 30 tablet; Refill: 5  Very well controlled for extended period of time with QAM clonazepam showing no signs of tolerance abuse or diversion. Continue current medications.  Return later this year for CPE.   The entirety of the information documented in the History of Present Illness, Review of Systems and Physical Exam were personally obtained by me. Portions of this information were initially documented by Glenn Rodriguez, CMA and reviewed by me for thoroughness and accuracy.      Glenn Huh, MD  [facility]

## 2018-09-08 NOTE — Patient Instructions (Signed)
.   Please review the attached list of medications and notify my office if there are any errors.   . Please bring all of your medications to every appointment so we can make sure that our medication list is the same as yours.   . We will have flu vaccines available after Labor Day. Please go to your pharmacy or call the office in early September to schedule you flu shot.   

## 2018-12-10 ENCOUNTER — Other Ambulatory Visit: Payer: Self-pay

## 2018-12-10 ENCOUNTER — Ambulatory Visit (INDEPENDENT_AMBULATORY_CARE_PROVIDER_SITE_OTHER): Payer: 59 | Admitting: Family Medicine

## 2018-12-10 ENCOUNTER — Encounter: Payer: Self-pay | Admitting: Family Medicine

## 2018-12-10 VITALS — BP 127/92 | Temp 98.7°F | Wt 180.0 lb

## 2018-12-10 DIAGNOSIS — J40 Bronchitis, not specified as acute or chronic: Secondary | ICD-10-CM | POA: Diagnosis not present

## 2018-12-10 DIAGNOSIS — Z130 Encounter for screening for diseases of the blood and blood-forming organs and certain disorders involving the immune mechanism: Secondary | ICD-10-CM

## 2018-12-10 DIAGNOSIS — Z13228 Encounter for screening for other metabolic disorders: Secondary | ICD-10-CM | POA: Diagnosis not present

## 2018-12-10 MED ORDER — AZITHROMYCIN 250 MG PO TABS: ORAL_TABLET | ORAL | 0 refills | Status: AC

## 2018-12-10 MED ORDER — ALBUTEROL SULFATE HFA 108 (90 BASE) MCG/ACT IN AERS: 1.0000 | INHALATION_SPRAY | Freq: Four times a day (QID) | RESPIRATORY_TRACT | 0 refills | Status: DC | PRN

## 2018-12-10 NOTE — Patient Instructions (Addendum)
Please let me know or go to an urgent care center if you start to have more trouble breathing or if you are not improving in 3-4 days.   Please go to the lab draw station in [address] on the second floor of [facility] when you are fasting for at least 8 hours. Normal lab hours are 8:00am to 12:30pm and 1:30pm to 4:00pm Monday through Friday   I've ordered a ABO Blood type test, but this test is not typically covered by insurance. You can call your insurance company to see if it's covered. If not you can call [facility] to find out the cost of the test.    Blood typing is done for free if you donate blood to the [employer]

## 2018-12-10 NOTE — Progress Notes (Signed)
Virtual Visit via Video Note  I connected with Glenn Rodriguez on 12/10/18 at  9:00 AM EDT by a video enabled telemedicine application and verified that I am speaking with the correct person using two identifiers.  Location: Patient: Home Provider: [facility]   I discussed the limitations of evaluation and management by telemedicine and the availability of in person appointments. The patient expressed understanding and agreed to proceed    Patient: Glenn Rodriguez Male    DOB: [birth year]   43 y.o.   MRN: [id number] Visit Date: 12/10/2018  Today's Provider: Mila Merry, MD   Chief Complaint  Patient presents with  . Cough   Subjective:     Cough This is a new problem. The current episode started in the past 7 days. The problem has been gradually worsening. The problem occurs constantly. The cough is productive of sputum. Associated symptoms include chest pain (when breathing in), ear congestion, a fever (high of 101), myalgias, nasal congestion, rhinorrhea and wheezing. Pertinent negatives include no chills, ear pain, headaches, heartburn, hemoptysis, postnasal drip, rash, sore throat, shortness of breath, sweats or weight loss. The symptoms are aggravated by lying down. Treatments tried: Mucinex D and Tylenol. The treatment provided mild relief.  He did have  Covid testing at work last week.   Allergies  Allergen Reactions  . Amoxicillin Other (See Comments)    Childhood reaction-unknown  . Anesthetics, Halogenated Other (See Comments)    Unknown reaction  . Sertraline     Suicidal ideation  . Venlafaxine Hcl     Suicidal ideation     Current Outpatient Medications:  .  azelastine (OPTIVAR) 0.05 % ophthalmic solution, Place 1 drop into both eyes 2 (two) times daily., Disp: 6 mL, Rfl: 1 .  clonazePAM (KLONOPIN) 0.5 MG tablet, Take 1 tablet (0.5 mg total) by mouth every morning., Disp: 30 tablet, Rfl: 5 .  triamcinolone ointment (KENALOG) 0.1 %, , Disp: , Rfl:  .  montelukast  (SINGULAIR) 10 MG tablet, Take 1 tablet (10 mg total) by mouth at bedtime. (Patient not taking: Reported on 12/10/2018), Disp: 90 tablet, Rfl: 0  Review of Systems  Constitutional: Positive for fever (high of 101). Negative for chills and weight loss.  HENT: Positive for rhinorrhea. Negative for ear pain, postnasal drip and sore throat.   Respiratory: Positive for cough and wheezing. Negative for hemoptysis and shortness of breath.   Cardiovascular: Positive for chest pain (when breathing in).  Gastrointestinal: Negative for heartburn.  Musculoskeletal: Positive for myalgias.  Skin: Negative for rash.  Neurological: Negative for headaches.    Social History   Tobacco Use  . Smoking status: Former Smoker    Quit date: 2002    Years since quitting: 18.8  . Smokeless tobacco: Never Used  . Tobacco comment: Remote: Quit when he was about 24  Substance Use Topics  . Alcohol use: Yes    Alcohol/week: 0.0 standard drinks    Comment: occasional       Objective:   BP (!) 127/92   Temp 98.7 F (37.1 C) (Oral)   Wt 180 lb (81.6 kg)   BMI 25.83 kg/m  Vitals:   12/10/18 0839  BP: (!) 127/92  Temp: 98.7 F (37.1 C)  TempSrc: Oral  Weight: 180 lb (81.6 kg)  Body mass index is 25.83 kg/m.   Physical Exam   General appearance: Overweight male, cooperative and in no acute distress Head: Normocephalic, without obvious abnormality, atraumatic Respiratory: Respirations even and  unlabored, normal respiratory rate. Coughing throughout interview Psych: Appropriate mood and affect. Neurologic: Mental status: Alert, oriented to person, place, and time, thought content appropriate.     Assessment & Plan     1. Bronchitis  - azithromycin (ZITHROMAX) 250 MG tablet; 2 by mouth today, then 1 daily for 4 days for bronchitis  Dispense: 6 tablet; Refill: 0 - albuterol (VENTOLIN HFA) 108 (90 Base) MCG/ACT inhaler; Inhale 1-2 puffs into the lungs every 6 (six) hours as needed for shortness  of breath.  Dispense: 8.5 g; Refill: 0  2. Screening for metabolic disorder  - Lipid panel - Comprehensive metabolic panel  3. Screening for blood disease Patient request for blood type to be checked. Advised that this may not be covered by his insurance.  - ABO AND RH   Other orders - triamcinolone ointment (KENALOG) 0.1 %   I discussed the assessment and treatment plan with the patient. The patient was provided an opportunity to ask questions and all were answered. The patient agreed with the plan and demonstrated an understanding of the instructions.   The patient was advised to call back or seek an in-person evaluation if the symptoms worsen or if the condition fails to improve as anticipated.  I provided 10 minutes of non-face-to-face time during this encounter.   The entirety of the information documented in the History of Present Illness, Review of Systems and Physical Exam were personally obtained by me. Portions of this information were initially documented by Minette Headland, CMA and reviewed by me for thoroughness and accuracy.      Lelon Huh, MD  [facility] Patient seen and examined by Dr. Lelon Huh, note scribed by Jennings Books, [facility]

## 2018-12-16 ENCOUNTER — Other Ambulatory Visit: Payer: Self-pay | Admitting: Family Medicine

## 2018-12-16 NOTE — Telephone Encounter (Signed)
Pt called saying he had a virtual visit Friday for chest congestion and cough.  He was given a Zpak and has finished it.  He is still coughing up stuff.  He wants to let Dr. Caryn Section know to see if he recommends anything else  CB#  [phone]  Con Memos

## 2018-12-17 MED ORDER — CEFDINIR 300 MG PO CAPS: 600.0000 mg | ORAL_CAPSULE | Freq: Every day | ORAL | 0 refills | Status: AC

## 2018-12-17 NOTE — Telephone Encounter (Signed)
Can send prescription for cefdinir to pharmacy of his choice, should also be taking OTC guaifenesin for chest congestion.

## 2018-12-17 NOTE — Telephone Encounter (Signed)
Patient advised. He states he has been taking Guaifenesin.

## 2019-03-28 ENCOUNTER — Other Ambulatory Visit: Payer: Self-pay | Admitting: Family Medicine

## 2019-03-28 DIAGNOSIS — F411 Generalized anxiety disorder: Secondary | ICD-10-CM

## 2019-03-28 DIAGNOSIS — Z8659 Personal history of other mental and behavioral disorders: Secondary | ICD-10-CM

## 2019-07-27 ENCOUNTER — Other Ambulatory Visit: Payer: Self-pay | Admitting: Family Medicine

## 2019-07-27 DIAGNOSIS — F411 Generalized anxiety disorder: Secondary | ICD-10-CM

## 2019-07-27 DIAGNOSIS — Z8659 Personal history of other mental and behavioral disorders: Secondary | ICD-10-CM

## 2019-07-27 NOTE — Telephone Encounter (Signed)
Requested medication (s) are due for refill today:  yes  Requested medication (s) are on the active medication list:  yes  Last refill:  06/27/2019  Future visit scheduled: no  Notes to clinic:  this refill cannot be delegated Overdue for visit    Requested Prescriptions  Pending Prescriptions Disp Refills   clonazePAM (KLONOPIN) 0.5 MG tablet [Pharmacy Med Name: CLONAZEPAM 0.5MG  TABLETS] 30 tablet     Sig: TAKE 1 TABLET(0.5 MG) BY MOUTH EVERY MORNING      Not Delegated - Psychiatry:  Anxiolytics/Hypnotics Failed - 07/27/2019  8:51 AM      Failed - This refill cannot be delegated      Failed - Urine Drug Screen completed in last 360 days.      Failed - Valid encounter within last 6 months    Recent Outpatient Visits           7 months ago Bronchitis   [facility] Malva Limes, MD   10 months ago History of panic attacks   [facility] Malva Limes, MD   1 year ago Allergic conjunctivitis of both eyes   [facility] Osvaldo Angst M, [place]   1 year ago Upper respiratory tract infection, unspecified type   [facility], [place], [place]   1 year ago Cough   [facility] Osvaldo Angst M, [place]

## 2019-09-01 ENCOUNTER — Telehealth (INDEPENDENT_AMBULATORY_CARE_PROVIDER_SITE_OTHER): Payer: 59 | Admitting: Physician Assistant

## 2019-09-01 ENCOUNTER — Encounter: Payer: Self-pay | Admitting: Physician Assistant

## 2019-09-01 DIAGNOSIS — W57XXXA Bitten or stung by nonvenomous insect and other nonvenomous arthropods, initial encounter: Secondary | ICD-10-CM | POA: Diagnosis not present

## 2019-09-01 DIAGNOSIS — L03119 Cellulitis of unspecified part of limb: Secondary | ICD-10-CM

## 2019-09-01 MED ORDER — PREDNISONE 10 MG (21) PO TBPK: ORAL_TABLET | ORAL | 0 refills | Status: DC

## 2019-09-01 MED ORDER — DOXYCYCLINE HYCLATE 100 MG PO TABS: 100.0000 mg | ORAL_TABLET | Freq: Two times a day (BID) | ORAL | 0 refills | Status: DC

## 2019-09-01 NOTE — Progress Notes (Signed)
MyChart Video Visit    Virtual Visit via Video Note   This visit type was conducted due to national recommendations for restrictions regarding the COVID-19 Pandemic (e.g. social distancing) in an effort to limit this patient's exposure and mitigate transmission in our community. This patient is at least at moderate risk for complications without adequate follow up. This format is felt to be most appropriate for this patient at this time. Physical exam was limited by quality of the video and audio technology used for the visit.   Patient location: Home Provider location: [facility]   Patient: Glenn Rodriguez   DOB: Nov 12, 1975   44 y.o. Male  MRN: [id number] Visit Date: 09/01/2019  Today's healthcare provider: [name], PA-C   Chief Complaint  Patient presents with  . Rash   Subjective    Rash This is a new problem. Episode onset: 5-6 days ago. The problem has been gradually worsening since onset. The affected locations include the left upper leg, left lower leg, right upper leg, right lower leg and left axilla. The rash is characterized by itchiness, redness and swelling. It is unknown (possible insect bites from working in the yard) if there was an exposure to a precipitant. Pertinent negatives include no fever, shortness of breath or vomiting. Treatments tried: hydrocortisone and benadryl cream. The treatment provided no relief.     Patient received the 2nd dose of the COVID vaccine [employer]) on 08/23/2019. He did have a slight fever afterwards that eventually resolved. Rash started 3-4 days after getting the 2nd COVID vaccine. He does not feel this is related, does feel this is more bug bites as he had been out working in the yard.    Patient Active Problem List   Diagnosis Date Noted  . History of panic attacks 09/08/2018  . Genital warts 07/09/2015  . Allergic rhinitis 07/09/2015  . Generalized anxiety disorder 02/24/2010   Past Medical History:  Diagnosis Date  .  Allergic rhinitis   . Depression   . History of genital warts   . Situational stress       Medications: Outpatient Medications Prior to Visit  Medication Sig  . azelastine (OPTIVAR) 0.05 % ophthalmic solution Place 1 drop into both eyes 2 (two) times daily.  . clonazePAM (KLONOPIN) 0.5 MG tablet TAKE 1 TABLET(0.5 MG) BY MOUTH EVERY MORNING  . albuterol (VENTOLIN HFA) 108 (90 Base) MCG/ACT inhaler Inhale 1-2 puffs into the lungs every 6 (six) hours as needed for shortness of breath.  . montelukast (SINGULAIR) 10 MG tablet Take 1 tablet (10 mg total) by mouth at bedtime. (Patient not taking: Reported on 12/10/2018)  . [DISCONTINUED] triamcinolone ointment (KENALOG) 0.1 %  (Patient not taking: Reported on 09/01/2019)   No facility-administered medications prior to visit.    Review of Systems  Constitutional: Negative for appetite change, chills and fever.  Respiratory: Negative for chest tightness, shortness of breath and wheezing.   Cardiovascular: Negative for chest pain and palpitations.  Gastrointestinal: Negative for abdominal pain, nausea and vomiting.  Skin: Positive for rash.    Last CBC Lab Results  Component Value Date   WBC 7.1 10/14/2015   HGB 15.3 10/14/2015   HCT 44.0 10/14/2015   MCV 85.7 10/14/2015   MCH 29.7 10/14/2015   RDW 12.8 10/14/2015   PLT 309 10/14/2015   Last metabolic panel Lab Results  Component Value Date   GLUCOSE 107 (H) 10/14/2015   NA 138 10/14/2015   K 4.0 10/14/2015  CL 105 10/14/2015   CO2 22 10/14/2015   BUN 20 10/14/2015   CREATININE 1.16 10/14/2015   GFRNONAA >60 10/14/2015   GFRAA >60 10/14/2015   CALCIUM 9.7 10/14/2015   PROT 7.2 03/25/2014   ALBUMIN 3.6 03/25/2014   BILITOT 0.7 03/25/2014   ALKPHOS 60 03/25/2014   AST 63 (H) 03/25/2014   ALT 98 (H) 03/25/2014   ANIONGAP 11 10/14/2015      Objective    There were no vitals taken for this visit. BP Readings from Last 3 Encounters:  12/10/18 (!) 127/92  09/08/18 (!)  144/98  06/11/18 (!) 139/92   Wt Readings from Last 3 Encounters:  12/10/18 180 lb (81.6 kg)  09/08/18 181 lb (82.1 kg)  05/31/18 178 lb (80.7 kg)      Physical Exam     Assessment & Plan     1. Cellulitis of lower extremity, unspecified laterality Does have some redness and swelling around a few of the bites. States they are warm to the touch, itch and there is one that is more painful. No purulent discharge. Will give prednisone for itching and swelling. Doxycycline will be sent for possible superimposed skin infection. Call if worsening.  - predniSONE (STERAPRED UNI-PAK 21 TAB) 10 MG (21) TBPK tablet; 6 day taper; take as instructed on package instructions  Dispense: 21 tablet; Refill: 0 - doxycycline (VIBRA-TABS) 100 MG tablet; Take 1 tablet (100 mg total) by mouth 2 (two) times daily.  Dispense: 14 tablet; Refill: 0  2. Bug bite, initial encounter See above medical treatment plan. - predniSONE (STERAPRED UNI-PAK 21 TAB) 10 MG (21) TBPK tablet; 6 day taper; take as instructed on package instructions  Dispense: 21 tablet; Refill: 0 - doxycycline (VIBRA-TABS) 100 MG tablet; Take 1 tablet (100 mg total) by mouth 2 (two) times daily.  Dispense: 14 tablet; Refill: 0   No follow-ups on file.     I discussed the assessment and treatment plan with the patient. The patient was provided an opportunity to ask questions and all were answered. The patient agreed with the plan and demonstrated an understanding of the instructions.   The patient was advised to call back or seek an in-person evaluation if the symptoms worsen or if the condition fails to improve as anticipated.  I provided 8 minutes of non-face-to-face time during this encounter.  Delmer Islam, PA-C, have reviewed all documentation for this visit. The documentation on 09/01/19 for the exam, diagnosis, procedures, and orders are all accurate and complete.  Reine Just [facility] [phone] (phone) [phone] (fax)  [facility]

## 2019-11-02 ENCOUNTER — Other Ambulatory Visit: Payer: Self-pay | Admitting: Family Medicine

## 2019-11-02 DIAGNOSIS — F411 Generalized anxiety disorder: Secondary | ICD-10-CM

## 2019-11-02 DIAGNOSIS — Z8659 Personal history of other mental and behavioral disorders: Secondary | ICD-10-CM

## 2019-11-02 NOTE — Telephone Encounter (Signed)
Pt has schedule next available appt for Friday 9.17.21/Pt asked if courtesy refill for clonazePAM (KLONOPIN) 0.5 MG tablet  can be sent to pharmacy/ please advise

## 2019-11-02 NOTE — Telephone Encounter (Signed)
Requested medication (s) are due for refill today: Yes  Requested medication (s) are on the active medication list: Yes  Last refill:  07/27/19  Future visit scheduled: No  Notes to clinic:  Unable to refill per protocol, cannot delegate     Requested Prescriptions  Pending Prescriptions Disp Refills   clonazePAM (KLONOPIN) 0.5 MG tablet [Pharmacy Med Name: CLONAZEPAM 0.5MG  TABLETS] 30 tablet     Sig: TAKE 1 TABLET(0.5 MG) BY MOUTH EVERY MORNING      Not Delegated - Psychiatry:  Anxiolytics/Hypnotics Failed - 11/02/2019 10:06 AM      Failed - This refill cannot be delegated      Failed - Urine Drug Screen completed in last 360 days.      Passed - Valid encounter within last 6 months    Recent Outpatient Visits           2 months ago Cellulitis of lower extremity, unspecified laterality   [facility] Joycelyn Man M, [place]   10 months ago Bronchitis   [facility] Malva Limes, MD   1 year ago History of panic attacks   [facility] Malva Limes, MD   1 year ago Allergic conjunctivitis of both eyes   [facility] Osvaldo Angst M, [place]   1 year ago Upper respiratory tract infection, unspecified type   [facility], [employer], [place]

## 2019-11-02 NOTE — Telephone Encounter (Signed)
Requested medications are due for refill today?  Yes - This medication refill cannot be delegated.    Requested medications are on active medication list? Yes  Last Refill:  07/27/2019  # 30 with 2 refills  Future visit scheduled? No   Notes to Clinic:  This medication refill cannot be delegated.

## 2019-11-02 NOTE — Telephone Encounter (Signed)
Patient overdue for follow up. Left message to call back. OK for [facility] to advise and schedule appointment.

## 2019-11-11 ENCOUNTER — Ambulatory Visit (INDEPENDENT_AMBULATORY_CARE_PROVIDER_SITE_OTHER): Payer: 59 | Admitting: Family Medicine

## 2019-11-11 ENCOUNTER — Encounter: Payer: Self-pay | Admitting: Family Medicine

## 2019-11-11 ENCOUNTER — Other Ambulatory Visit: Payer: Self-pay

## 2019-11-11 VITALS — BP 123/91 | HR 76 | Temp 98.9°F | Resp 16 | Ht 70.0 in | Wt 191.0 lb

## 2019-11-11 DIAGNOSIS — F411 Generalized anxiety disorder: Secondary | ICD-10-CM | POA: Diagnosis not present

## 2019-11-11 DIAGNOSIS — J301 Allergic rhinitis due to pollen: Secondary | ICD-10-CM

## 2019-11-11 NOTE — Patient Instructions (Signed)
. Please review the attached list of medications and notify my office if there are any errors.   . Please bring all of your medications to every appointment so we can make sure that our medication list is the same as yours.    DASH Eating Plan DASH stands for "Dietary Approaches to Stop Hypertension." The DASH eating plan is a healthy eating plan that has been shown to reduce high blood pressure (hypertension). It may also reduce your risk for type 2 diabetes, heart disease, and stroke. The DASH eating plan may also help with weight loss. What are tips for following this plan?  General guidelines  Avoid eating more than 2,300 mg (milligrams) of salt (sodium) a day. If you have hypertension, you may need to reduce your sodium intake to 1,500 mg a day.  Limit alcohol intake to no more than 1 drink a day for nonpregnant women and 2 drinks a day for men. One drink equals 12 oz of beer, 5 oz of wine, or 1 oz of hard liquor.  Work with your health care provider to maintain a healthy body weight or to lose weight. Ask what an ideal weight is for you.  Get at least 30 minutes of exercise that causes your heart to beat faster (aerobic exercise) most days of the week. Activities may include walking, swimming, or biking.  Work with your health care provider or diet and nutrition specialist (dietitian) to adjust your eating plan to your individual calorie needs. Reading food labels   Check food labels for the amount of sodium per serving. Choose foods with less than 5 percent of the Daily Value of sodium. Generally, foods with less than 300 mg of sodium per serving fit into this eating plan.  To find whole grains, look for the word "whole" as the first word in the ingredient list. Shopping  Buy products labeled as "low-sodium" or "no salt added."  Buy fresh foods. Avoid canned foods and premade or frozen meals. Cooking  Avoid adding salt when cooking. Use salt-free seasonings or herbs instead  of table salt or sea salt. Check with your health care provider or pharmacist before using salt substitutes.  Do not fry foods. Cook foods using healthy methods such as baking, boiling, grilling, and broiling instead.  Cook with heart-healthy oils, such as olive, canola, soybean, or sunflower oil. Meal planning  Eat a balanced diet that includes: ? 5 or more servings of fruits and vegetables each day. At each meal, try to fill half of your plate with fruits and vegetables. ? Up to 6-8 servings of whole grains each day. ? Less than 6 oz of lean meat, poultry, or fish each day. A 3-oz serving of meat is about the same size as a deck of cards. One egg equals 1 oz. ? 2 servings of low-fat dairy each day. ? A serving of nuts, seeds, or beans 5 times each week. ? Heart-healthy fats. Healthy fats called Omega-3 fatty acids are found in foods such as flaxseeds and coldwater fish, like sardines, salmon, and mackerel.  Limit how much you eat of the following: ? Canned or prepackaged foods. ? Food that is high in trans fat, such as fried foods. ? Food that is high in saturated fat, such as fatty meat. ? Sweets, desserts, sugary drinks, and other foods with added sugar. ? Full-fat dairy products.  Do not salt foods before eating.  Try to eat at least 2 vegetarian meals each week.  Eat more home-cooked   food and less restaurant, buffet, and fast food.  When eating at a restaurant, ask that your food be prepared with less salt or no salt, if possible. What foods are recommended? The items listed may not be a complete list. Talk with your dietitian about what dietary choices are best for you. Grains Whole-grain or whole-wheat bread. Whole-grain or whole-wheat pasta. Brown rice. Oatmeal. Quinoa. Bulgur. Whole-grain and low-sodium cereals. Pita bread. Low-fat, low-sodium crackers. Whole-wheat flour tortillas. Vegetables Fresh or frozen vegetables (raw, steamed, roasted, or grilled). Low-sodium or  reduced-sodium tomato and vegetable juice. Low-sodium or reduced-sodium tomato sauce and tomato paste. Low-sodium or reduced-sodium canned vegetables. Fruits All fresh, dried, or frozen fruit. Canned fruit in natural juice (without added sugar). Meat and other protein foods Skinless chicken or turkey. Ground chicken or turkey. Pork with fat trimmed off. Fish and seafood. Egg whites. Dried beans, peas, or lentils. Unsalted nuts, nut butters, and seeds. Unsalted canned beans. Lean cuts of beef with fat trimmed off. Low-sodium, lean deli meat. Dairy Low-fat (1%) or fat-free (skim) milk. Fat-free, low-fat, or reduced-fat cheeses. Nonfat, low-sodium ricotta or cottage cheese. Low-fat or nonfat yogurt. Low-fat, low-sodium cheese. Fats and oils Soft margarine without trans fats. Vegetable oil. Low-fat, reduced-fat, or light mayonnaise and salad dressings (reduced-sodium). Canola, safflower, olive, soybean, and sunflower oils. Avocado. Seasoning and other foods Herbs. Spices. Seasoning mixes without salt. Unsalted popcorn and pretzels. Fat-free sweets. What foods are not recommended? The items listed may not be a complete list. Talk with your dietitian about what dietary choices are best for you. Grains Baked goods made with fat, such as croissants, muffins, or some breads. Dry pasta or rice meal packs. Vegetables Creamed or fried vegetables. Vegetables in a cheese sauce. Regular canned vegetables (not low-sodium or reduced-sodium). Regular canned tomato sauce and paste (not low-sodium or reduced-sodium). Regular tomato and vegetable juice (not low-sodium or reduced-sodium). Pickles. Olives. Fruits Canned fruit in a light or heavy syrup. Fried fruit. Fruit in cream or butter sauce. Meat and other protein foods Fatty cuts of meat. Ribs. Fried meat. Bacon. Sausage. Bologna and other processed lunch meats. Salami. Fatback. Hotdogs. Bratwurst. Salted nuts and seeds. Canned beans with added salt. Canned or  smoked fish. Whole eggs or egg yolks. Chicken or turkey with skin. Dairy Whole or 2% milk, cream, and half-and-half. Whole or full-fat cream cheese. Whole-fat or sweetened yogurt. Full-fat cheese. Nondairy creamers. Whipped toppings. Processed cheese and cheese spreads. Fats and oils Butter. Stick margarine. Lard. Shortening. Ghee. Bacon fat. Tropical oils, such as coconut, palm kernel, or palm oil. Seasoning and other foods Salted popcorn and pretzels. Onion salt, garlic salt, seasoned salt, table salt, and sea salt. Worcestershire sauce. Tartar sauce. Barbecue sauce. Teriyaki sauce. Soy sauce, including reduced-sodium. Steak sauce. Canned and packaged gravies. Fish sauce. Oyster sauce. Cocktail sauce. Horseradish that you find on the shelf. Ketchup. Mustard. Meat flavorings and tenderizers. Bouillon cubes. Hot sauce and Tabasco sauce. Premade or packaged marinades. Premade or packaged taco seasonings. Relishes. Regular salad dressings. Where to find more information:  National Heart, Lung, and Blood Institute: www.nhlbi.nih.gov  American Heart Association: www.heart.org Summary  The DASH eating plan is a healthy eating plan that has been shown to reduce high blood pressure (hypertension). It may also reduce your risk for type 2 diabetes, heart disease, and stroke.  With the DASH eating plan, you should limit salt (sodium) intake to 2,300 mg a day. If you have hypertension, you may need to reduce your sodium intake to 1,500 mg   a day.  When on the DASH eating plan, aim to eat more fresh fruits and vegetables, whole grains, lean proteins, low-fat dairy, and heart-healthy fats.  Work with your health care provider or diet and nutrition specialist (dietitian) to adjust your eating plan to your individual calorie needs. This information is not intended to replace advice given to you by your health care provider. Make sure you discuss any questions you have with your health care provider. Document  Revised: 01/23/2017 Document Reviewed: 02/04/2016 Elsevier Patient Education  2020 Elsevier Inc.  

## 2019-11-11 NOTE — Progress Notes (Signed)
I,April Miller,acting as a scribe for Mila Merry, MD.,have documented all relevant documentation on the behalf of Mila Merry, MD,as directed by  Mila Merry, MD while in the presence of Mila Merry, MD.   Established patient visit   Patient: Glenn Rodriguez   DOB: August 15, 1975   44 y.o. Male  MRN: [id number] Visit Date: 11/11/2019  Today's healthcare provider: Mila Merry, MD   Chief Complaint  Patient presents with  . Anxiety  . Follow-up   Subjective    HPI  Anxiety, Follow-up  He was last seen for anxiety 09/08/2018. Changes made at last visit include; refilled clonazepam.   He reports good compliance with treatment. He reports good tolerance of treatment. He is not having side effects. none  He feels his anxiety is mild and Improved since last visit.  Symptoms: No chest pain No difficulty concentrating  No dizziness No fatigue  No feelings of losing control No insomnia  Yes irritable No palpitations  No panic attacks No racing thoughts  Yes shortness of breath No sweating  No tremors/shakes    GAD-7 Results GAD-7 Generalized Anxiety Disorder Screening Tool 11/11/2019  1. Feeling Nervous, Anxious, or on Edge 0  2. Not Being Able to Stop or Control Worrying 0  3. Worrying Too Much About Different Things 0  4. Trouble Relaxing 0  5. Being So Restless it's Hard To Sit Still 0  6. Becoming Easily Annoyed or Irritable 1  7. Feeling Afraid As If Something Awful Might Happen 0  Total GAD-7 Score 1  Difficulty At Work, Home, or Getting  Along With Others? Not difficult at all    PHQ-9 Scores PHQ9 SCORE ONLY 11/11/2019  PHQ-9 Total Score 2    He continues to take one clonazepam every morning which remains effective. No adverse effects.feels very alert when he takes. No memory problems.   He dose have trouble with allergies and montelukast did not help. Does not tolerate fluticasone nasal spray. He does take chorphenirimine/PSE most days which works pretty  well. He rarely uses albuterol, has been at least a year.  --------------------------------------------------------------------    Medications: Outpatient Medications Prior to Visit  Medication Sig  . albuterol (VENTOLIN HFA) 108 (90 Base) MCG/ACT inhaler Inhale 1-2 puffs into the lungs every 6 (six) hours as needed for shortness of breath.  Marland Kitchen azelastine (OPTIVAR) 0.05 % ophthalmic solution Place 1 drop into both eyes 2 (two) times daily.  . Chlorpheniramine-Phenylephrine 4-10 MG tablet Take 1 tablet by mouth daily as needed for congestion.  . clonazePAM (KLONOPIN) 0.5 MG tablet TAKE 1 TABLET(0.5 MG) BY MOUTH EVERY MORNING  . [DISCONTINUED] doxycycline (VIBRA-TABS) 100 MG tablet Take 1 tablet (100 mg total) by mouth 2 (two) times daily. (Patient not taking: Reported on 11/11/2019)  . [DISCONTINUED] montelukast (SINGULAIR) 10 MG tablet Take 1 tablet (10 mg total) by mouth at bedtime. (Patient not taking: Reported on 12/10/2018)  . [DISCONTINUED] predniSONE (STERAPRED UNI-PAK 21 TAB) 10 MG (21) TBPK tablet 6 day taper; take as instructed on package instructions (Patient not taking: Reported on 11/11/2019)   No facility-administered medications prior to visit.    Review of Systems  Constitutional: Negative for appetite change, chills and fever.  Respiratory: Negative for chest tightness, shortness of breath and wheezing.   Cardiovascular: Negative for chest pain and palpitations.  Gastrointestinal: Negative for abdominal pain, nausea and vomiting.     Objective    BP (!) 123/91 (BP Location: Right Arm, Patient Position: Sitting, Cuff Size: Large)  Pulse 76   Temp 98.9 F (37.2 C) (Oral)   Resp 16   Ht 5\' 10"  (1.778 m)   Wt 191 lb (86.6 kg)   SpO2 100%   BMI 27.41 kg/m   Physical Exam  General appearance:  Overweight male, cooperative and in no acute distress Head: Normocephalic, without obvious abnormality, atraumatic Respiratory: Respirations even and unlabored, normal  respiratory rate Extremities: All extremities are intact.  Skin: Skin color, texture, turgor normal. No rashes seen  Psych: Appropriate mood and affect. Neurologic: Mental status: Alert, oriented to person, place, and time, thought content appropriate.   No results found for any visits on 11/11/19.  Assessment & Plan     1. Generalized anxiety disorder Continue to do well taking one clonazepam every morning.  Follow up annually.   2. Non-seasonal allergic rhinitis due to pollen Counseled to keep an eye on his blood sugar and to lay of PSE if it stays elevated.    He anticipates getting flu shot at work.         The entirety of the information documented in the History of Present Illness, Review of Systems and Physical Exam were personally obtained by me. Portions of this information were initially documented by the CMA and reviewed by me for thoroughness and accuracy.      11/13/19, MD  [facility] [phone] (phone) [phone] (fax)  [facility]

## 2019-12-02 ENCOUNTER — Other Ambulatory Visit: Payer: Self-pay | Admitting: Family Medicine

## 2019-12-02 DIAGNOSIS — F411 Generalized anxiety disorder: Secondary | ICD-10-CM

## 2019-12-02 DIAGNOSIS — Z8659 Personal history of other mental and behavioral disorders: Secondary | ICD-10-CM

## 2020-02-29 ENCOUNTER — Other Ambulatory Visit: Payer: Self-pay | Admitting: Family Medicine

## 2020-02-29 DIAGNOSIS — Z8659 Personal history of other mental and behavioral disorders: Secondary | ICD-10-CM

## 2020-02-29 DIAGNOSIS — F411 Generalized anxiety disorder: Secondary | ICD-10-CM

## 2020-02-29 NOTE — Telephone Encounter (Signed)
Requested medication (s) are due for refill today: yes  Requested medication (s) are on the active medication list: yes  Last refill:  01/30/2020  Future visit scheduled: no  Notes to clinic:  this refill cannot be delegated    Requested Prescriptions  Pending Prescriptions Disp Refills   clonazePAM (KLONOPIN) 0.5 MG tablet [Pharmacy Med Name: CLONAZEPAM 0.5MG  TABLETS] 30 tablet     Sig: TAKE 1 TABLET(0.5 MG) BY MOUTH EVERY MORNING      Not Delegated - Psychiatry:  Anxiolytics/Hypnotics Failed - 02/29/2020  7:03 AM      Failed - This refill cannot be delegated      Failed - Urine Drug Screen completed in last 360 days      Passed - Valid encounter within last 6 months    Recent Outpatient Visits           3 months ago Generalized anxiety disorder   [facility] Malva Limes, MD   6 months ago Cellulitis of lower extremity, unspecified laterality   [facility] [place], Alessandra Bevels, [place]   1 year ago Bronchitis   [facility] Malva Limes, MD   1 year ago History of panic attacks   [facility] Malva Limes, MD   1 year ago Allergic conjunctivitis of both eyes   [facility], Ricki Rodriguez [place], [place]

## 2020-06-26 ENCOUNTER — Other Ambulatory Visit: Payer: Self-pay | Admitting: Family Medicine

## 2020-06-26 DIAGNOSIS — Z8659 Personal history of other mental and behavioral disorders: Secondary | ICD-10-CM

## 2020-06-26 DIAGNOSIS — F411 Generalized anxiety disorder: Secondary | ICD-10-CM

## 2020-06-26 NOTE — Telephone Encounter (Signed)
Requested medication (s) are due for refill today:   Provider to review Requested medication (s) are on the active medication list:   Yes   Future visit scheduled:   No   Last ordered: 02/29/2020, #30, 3 refills  Non delegated refill    Requested Prescriptions  Pending Prescriptions Disp Refills   clonazePAM (KLONOPIN) 0.5 MG tablet [Pharmacy Med Name: CLONAZEPAM 0.5MG  TABLETS] 30 tablet     Sig: TAKE 1 TABLET(0.5 MG) BY MOUTH EVERY MORNING      Not Delegated - Psychiatry:  Anxiolytics/Hypnotics Failed - 06/26/2020  7:38 AM      Failed - This refill cannot be delegated      Failed - Urine Drug Screen completed in last 360 days      Failed - Valid encounter within last 6 months    Recent Outpatient Visits           7 months ago Generalized anxiety disorder   [facility] Malva Limes, MD   9 months ago Cellulitis of lower extremity, unspecified laterality   [facility] [place], Alessandra Bevels, [place]   1 year ago Bronchitis   [facility] Malva Limes, MD   1 year ago History of panic attacks   [facility] Malva Limes, MD   1 year ago Allergic conjunctivitis of both eyes   [facility] [place], Ricki Rodriguez [place], [place]

## 2020-07-16 ENCOUNTER — Telehealth: Payer: Self-pay | Admitting: Nurse Practitioner

## 2020-07-16 ENCOUNTER — Encounter: Payer: Self-pay | Admitting: Nurse Practitioner

## 2020-07-16 DIAGNOSIS — S60862A Insect bite (nonvenomous) of left wrist, initial encounter: Secondary | ICD-10-CM

## 2020-07-16 DIAGNOSIS — W57XXXA Bitten or stung by nonvenomous insect and other nonvenomous arthropods, initial encounter: Secondary | ICD-10-CM

## 2020-07-16 MED ORDER — PREDNISONE 10 MG PO TABS: 10.0000 mg | ORAL_TABLET | Freq: Every day | ORAL | 0 refills | Status: AC

## 2020-07-16 NOTE — Progress Notes (Signed)
[name], haubner are scheduled for a virtual visit with your provider today.    Just as we do with appointments in the office, we must obtain your consent to participate.  Your consent will be active for this visit and any virtual visit you may have with one of our providers in the next 365 days.    If you have a MyChart account, I can also send a copy of this consent to you electronically.  All virtual visits are billed to your insurance company just like a traditional visit in the office.  As this is a virtual visit, video technology does not allow for your provider to perform a traditional examination.  This may limit your provider's ability to fully assess your condition.  If your provider identifies any concerns that need to be evaluated in person or the need to arrange testing such as labs, EKG, etc, we will make arrangements to do so.    Although advances in technology are sophisticated, we cannot ensure that it will always work on either your end or our end.  If the connection with a video visit is poor, we may have to switch to a telephone visit.  With either a video or telephone visit, we are not always able to ensure that we have a secure connection.   I need to obtain your verbal consent now.   Are you willing to proceed with your visit today?   Glenn Rodriguez has provided verbal consent on 07/16/2020 for a virtual visit (video or telephone).   Glenn Simas, FNP 07/16/2020  9:10 AM    Virtual Visit via Video   I connected with patient on 07/16/20 at  9:00 AM EDT by a video enabled telemedicine application and verified that I am speaking with the correct person using two identifiers.  Location patient: Work Location provider: [facility] Persons participating in the virtual visit: Patient, Provider  I discussed the limitations of evaluation and management by telemedicine and the availability of in person appointments. The patient expressed understanding and agreed to  proceed.  Subjective:   HPI:   Patient presents via [facility] today with complaints of black ant bites to left wrist. He sustained these bites yesterday while cutting his grass. Pain at time of bite was comparable to yellow jacket bite per patient. He did use hydrocortisone cream topically and took a benadryl but the swelling has continued into his 4th & 5th finger today. He has needed oral steroids in the past for contact with plants he has been allergic to like poison ivy. Has tolerated prednisone well.   Denies other symptoms or complaints today. Denies any respiratory complaints, coughing, SOB or congestion.  ROS:   See pertinent positives and negatives per HPI. +swelling left wrist and fingers +erythema at bite location  -SOB -cough -respiratory symptoms  Patient Active Problem List   Diagnosis Date Noted  . History of panic attacks 09/08/2018  . Genital warts 07/09/2015  . Allergic rhinitis 07/09/2015  . Generalized anxiety disorder 02/24/2010    Social History   Tobacco Use  . Smoking status: Former Smoker    Quit date: 2002    Years since quitting: 20.4  . Smokeless tobacco: Never Used  . Tobacco comment: Remote: Quit when he was about 24  Substance Use Topics  . Alcohol use: Yes    Alcohol/week: 0.0 standard drinks    Comment: occasional     Current Outpatient Medications:  .  albuterol (VENTOLIN HFA) 108 (  90 Base) MCG/ACT inhaler, Inhale 1-2 puffs into the lungs every 6 (six) hours as needed for shortness of breath., Disp: 8.5 g, Rfl: 0 .  azelastine (OPTIVAR) 0.05 % ophthalmic solution, Place 1 drop into both eyes 2 (two) times daily., Disp: 6 mL, Rfl: 1 .  Chlorpheniramine-Phenylephrine 4-10 MG tablet, Take 1 tablet by mouth daily as needed for congestion., Disp: , Rfl:  .  clonazePAM (KLONOPIN) 0.5 MG tablet, TAKE 1 TABLET(0.5 MG) BY MOUTH EVERY MORNING, Disp: 30 tablet, Rfl: 3  Allergies  Allergen Reactions  . Amoxicillin Other (See Comments)    Childhood  reaction-unknown  . Anesthetics, Halogenated Other (See Comments)    Unknown reaction  . Sertraline     Suicidal ideation  . Venlafaxine Hcl     Suicidal ideation    Objective:   There were no vitals taken for this visit.  Patient is well-developed, well-nourished in no acute distress.  Resting comfortably at work.  Head is normocephalic, atraumatic.  No labored breathing.  Speech is clear and coherent with logical content.  Patient is alert and oriented at baseline.  Insect bite location to left hand and wrist with noted swelling distal to bite into finger of left hand. No drainage from bite locations or evidence for infection at this time.   Assessment and Plan:   Patient advised to stop topical steroid creams, may use topical Benadryl cream. Will start oral steroids today. Instructed to take medication with food. Avoid NSAIDs while using steroids, may use tylenol if pain relief is needed. Schedule follow up appointment or seek medical care at [facility] with new or worsening symptoms as discussed.   May was area with mild soap and water and use ice as needed.   Prescriptions sent are as follows: Meds ordered this encounter  Medications  . predniSONE (DELTASONE) 10 MG tablet    Sig: Take 1 tablet (10 mg total) by mouth daily with breakfast for 6 days. Take 6 tablets on day one, 5 tablets on day two, 4 tablets on day three, 3 tablets on day four, 2 tablets on day five, 1 tablet on day six. Take with food    Dispense:  21 tablet    Refill:  0     Glenn Simas, FNP 07/16/2020

## 2020-07-16 NOTE — Patient Instructions (Signed)
May use Benadryl cream topical  Avoids advil/iuprofen and aleve while using prednisone.   Take Prednisone with food   May use ice to area for relief  May use oral tylenol for pain relief.   Seek follow up care for new or worsening symptoms as discussed.   Thank you

## 2020-10-24 ENCOUNTER — Telehealth: Payer: Self-pay | Admitting: Family Medicine

## 2020-10-24 DIAGNOSIS — Z8659 Personal history of other mental and behavioral disorders: Secondary | ICD-10-CM

## 2020-10-24 DIAGNOSIS — F411 Generalized anxiety disorder: Secondary | ICD-10-CM

## 2020-10-24 NOTE — Telephone Encounter (Signed)
Requested medication (s) are due for refill today: Yes  Requested medication (s) are on the active medication list: Yes  Last refill:  06/26/20  Future visit scheduled: No  Notes to clinic:  See request.    Requested Prescriptions  Pending Prescriptions Disp Refills   clonazePAM (KLONOPIN) 0.5 MG tablet [Pharmacy Med Name: CLONAZEPAM 0.5MG  TABLETS] 30 tablet     Sig: TAKE 1 TABLET(0.5 MG) BY MOUTH EVERY MORNING     Not Delegated - Psychiatry:  Anxiolytics/Hypnotics Failed - 10/24/2020  7:54 AM      Failed - This refill cannot be delegated      Failed - Urine Drug Screen completed in last 360 days      Failed - Valid encounter within last 6 months    Recent Outpatient Visits           11 months ago Generalized anxiety disorder   [facility] Malva Limes, MD   1 year ago Cellulitis of lower extremity, unspecified laterality   [facility] [place], Alessandra Bevels, [place]   1 year ago Bronchitis   [facility] Malva Limes, MD   2 years ago History of panic attacks   [facility] Malva Limes, MD   2 years ago Allergic conjunctivitis of both eyes   [facility] [place], Ricki Rodriguez [place], [place]

## 2020-11-09 ENCOUNTER — Ambulatory Visit (INDEPENDENT_AMBULATORY_CARE_PROVIDER_SITE_OTHER): Payer: 59 | Admitting: Family Medicine

## 2020-11-09 ENCOUNTER — Encounter: Payer: Self-pay | Admitting: Family Medicine

## 2020-11-09 ENCOUNTER — Other Ambulatory Visit: Payer: Self-pay

## 2020-11-09 VITALS — BP 137/102 | HR 68 | Temp 98.3°F | Wt 202.0 lb

## 2020-11-09 DIAGNOSIS — F411 Generalized anxiety disorder: Secondary | ICD-10-CM

## 2020-11-09 NOTE — Progress Notes (Signed)
Established patient visit   Patient: Glenn Rodriguez   DOB: October 01, 1975   45 y.o. Male  MRN: [id number] Visit Date: 11/09/2020  Today's healthcare provider: Mila Merry, MD   Chief Complaint  Patient presents with   Anxiety   Subjective    HPI  Anxiety, Follow-up  He was last seen for anxiety 1  year  ago. Changes made at last visit include no changes.   He reports excellent compliance with treatment. He reports excellent tolerance of treatment. He is not having side effects.   Pt states his anxiety is in good control with her current medications.  Symptoms: No chest pain No difficulty concentrating  No dizziness No fatigue  No feelings of losing control No insomnia  No irritable No palpitations  No panic attacks No racing thoughts  No shortness of breath No sweating  No tremors/shakes    GAD-7 Results GAD-7 Generalized Anxiety Disorder Screening Tool 11/09/2020 11/11/2019  1. Feeling Nervous, Anxious, or on Edge 1 0  2. Not Being Able to Stop or Control Worrying 0 0  3. Worrying Too Much About Different Things 2 0  4. Trouble Relaxing 0 0  5. Being So Restless it's Hard To Sit Still 0 0  6. Becoming Easily Annoyed or Irritable 1 1  7. Feeling Afraid As If Something Awful Might Happen 0 0  Total GAD-7 Score 4 1  Difficulty At Work, Home, or Getting  Along With Others? Somewhat difficult Not difficult at all    PHQ-9 Scores PHQ9 SCORE ONLY 11/09/2020 11/11/2019  PHQ-9 Total Score 2 2    ---------------------------------------------------------------------------------------------------     Medications: Outpatient Medications Prior to Visit  Medication Sig   azelastine (OPTIVAR) 0.05 % ophthalmic solution Place 1 drop into both eyes 2 (two) times daily.   Chlorpheniramine-Phenylephrine 4-10 MG tablet Take 1 tablet by mouth daily as needed for congestion.   clonazePAM (KLONOPIN) 0.5 MG tablet TAKE 1 TABLET(0.5 MG) BY MOUTH EVERY MORNING   albuterol  (VENTOLIN HFA) 108 (90 Base) MCG/ACT inhaler Inhale 1-2 puffs into the lungs every 6 (six) hours as needed for shortness of breath.   No facility-administered medications prior to visit.    Review of Systems  Constitutional: Negative.   Respiratory: Negative.    Cardiovascular: Negative.   Gastrointestinal: Negative.   Neurological:  Negative for dizziness, light-headedness and headaches.  Psychiatric/Behavioral:  Negative for decreased concentration, dysphoric mood, self-injury, sleep disturbance and suicidal ideas. The patient is nervous/anxious.       Objective    BP (!) 137/102 (BP Location: Left Arm, Patient Position: Sitting, Cuff Size: Large)   Pulse 68   Temp 98.3 F (36.8 C)   Wt 202 lb (91.6 kg)   SpO2 99%   BMI 28.98 kg/m    Physical Exam  General appearance:  Overweight male, cooperative and in no acute distress Head: Normocephalic, without obvious abnormality, atraumatic Respiratory: Respirations even and unlabored, normal respiratory rate Extremities: All extremities are intact.  Skin: Skin color, texture, turgor normal. No rashes seen  Psych: Appropriate mood and affect. Neurologic: Mental status: Alert, oriented to person, place, and time, thought content appropriate.     Assessment & Plan     1. Generalized anxiety disorder Continues to do well with QAM clonazepam. Will refill before current prescription runs. Advised to schedule CPE in 2023    He anticipates getting flu shot at work.       The entirety of the information documented in  the History of Present Illness, Review of Systems and Physical Exam were personally obtained by me. Portions of this information were initially documented by the CMA and reviewed by me for thoroughness and accuracy.     Tawsha Terrero, MD  [facility] [phone] (phone) [phone] (fax)  [facility]  

## 2020-11-09 NOTE — Patient Instructions (Signed)
.   Please review the attached list of medications and notify my office if there are any errors.   . Please bring all of your medications to every appointment so we can make sure that our medication list is the same as yours.   

## 2020-11-19 ENCOUNTER — Other Ambulatory Visit: Payer: Self-pay | Admitting: Family Medicine

## 2020-11-19 DIAGNOSIS — Z8659 Personal history of other mental and behavioral disorders: Secondary | ICD-10-CM

## 2020-11-19 DIAGNOSIS — F411 Generalized anxiety disorder: Secondary | ICD-10-CM

## 2020-11-19 MED ORDER — CLONAZEPAM 0.5 MG PO TABS: ORAL_TABLET | ORAL | 5 refills | Status: DC

## 2020-11-20 NOTE — Telephone Encounter (Signed)
Patient says refill was sent to the wrong pharmacy and hasnt been picked for colazapam. He wants it sent to  [id number] - [place], [place] - [address] Phone:  [phone]  Fax:  [phone]

## 2020-11-21 ENCOUNTER — Other Ambulatory Visit: Payer: Self-pay | Admitting: Family Medicine

## 2020-11-21 DIAGNOSIS — F411 Generalized anxiety disorder: Secondary | ICD-10-CM

## 2020-11-21 DIAGNOSIS — Z8659 Personal history of other mental and behavioral disorders: Secondary | ICD-10-CM

## 2020-11-21 NOTE — Telephone Encounter (Signed)
Requested medication (s) are due for refill today - no  Requested medication (s) are on the active medication list -yes  Future visit scheduled -no  Last refill: 11/19/20 #30 5RF  Notes to clinic: Request RF: non delegated Rx- may be different pharmacy from where sent 9/26  Requested Prescriptions  Pending Prescriptions Disp Refills   clonazePAM (KLONOPIN) 0.5 MG tablet [Pharmacy Med Name: CLONAZEPAM 0.5MG  TABLETS] 30 tablet     Sig: TAKE 1 TABLET(0.5 MG) BY MOUTH EVERY MORNING     Not Delegated - Psychiatry:  Anxiolytics/Hypnotics Failed - 11/21/2020  2:42 PM      Failed - This refill cannot be delegated      Failed - Urine Drug Screen completed in last 360 days      Passed - Valid encounter within last 6 months    Recent Outpatient Visits           1 week ago Generalized anxiety disorder   [facility] Malva Limes, MD   1 year ago Generalized anxiety disorder   [facility] [place], Demetrios Isaacs, [place]   1 year ago Cellulitis of lower extremity, unspecified laterality   [facility] [place], Alessandra [place], [place]   1 year ago Bronchitis   [facility] Malva Limes, MD   2 years ago History of panic attacks   [facility] Malva Limes, MD                 Requested Prescriptions  Pending Prescriptions Disp Refills   clonazePAM (KLONOPIN) 0.5 MG tablet [Pharmacy Med Name: CLONAZEPAM 0.5MG  TABLETS] 30 tablet     Sig: TAKE 1 TABLET(0.5 MG) BY MOUTH EVERY MORNING     Not Delegated - Psychiatry:  Anxiolytics/Hypnotics Failed - 11/21/2020  2:42 PM      Failed - This refill cannot be delegated      Failed - Urine Drug Screen completed in last 360 days      Passed - Valid encounter within last 6 months    Recent Outpatient Visits           1 week ago Generalized anxiety disorder   [facility] Malva Limes, MD   1 year ago Generalized anxiety disorder   [facility] Malva Limes, MD   1 year ago Cellulitis of lower extremity, unspecified laterality   [facility] [place], Alessandra [place], [place]   1 year ago Bronchitis   [facility] Malva Limes, MD   2 years ago History of panic attacks   [facility] Malva Limes, MD

## 2020-11-22 NOTE — Telephone Encounter (Signed)
LOV: 11/19/2020 NOV: None   Last Refill: 11/19/2020 #30 5 Refills.

## 2020-11-22 NOTE — Telephone Encounter (Addendum)
Pt is calling and rx clonazepam went to [facility] in [place] on [address]. Pt states [facility] [place] can not transfer rx due to being new rx. Please send to [facility] [place] phone number [phone]

## 2020-11-23 ENCOUNTER — Telehealth: Payer: Self-pay | Admitting: Family Medicine

## 2020-11-23 MED ORDER — CLONAZEPAM 0.5 MG PO TABS: ORAL_TABLET | ORAL | 5 refills | Status: DC

## 2020-11-23 NOTE — Telephone Encounter (Unsigned)
Copied from CRM [id number]. Topic: Quick Communication - Rx Refill/Question >> Nov 23, 2020  2:44 PM Gaetana Michaelis A wrote: Medication: clonazePAM (KLONOPIN) 0.5 MG tablet [[id number]]   Has the patient contacted their pharmacy? Yes.   (Agent: If no, request that the patient contact the pharmacy for the refill.) (Agent: If yes, when and what did the pharmacy advise?)  Preferred Pharmacy (with phone number or street name): [id number] - [place], [place] - [address]  Phone:  [phone] Fax:  [phone]  Has the patient been seen for an appointment in the last year OR does the patient have an upcoming appointment? Yes.    Agent: Please be advised that RX refills may take up to 3 business days. We ask that you follow-up with your pharmacy.

## 2020-11-23 NOTE — Addendum Note (Signed)
Addended by: Paschal Dopp on: 11/23/2020 08:48 AM   Modules accepted: Orders

## 2020-11-23 NOTE — Addendum Note (Signed)
Addended by: Malva Limes on: 11/23/2020 05:49 PM   Modules accepted: Orders

## 2020-12-07 ENCOUNTER — Telehealth: Payer: 59 | Admitting: Physician Assistant

## 2020-12-07 DIAGNOSIS — I10 Essential (primary) hypertension: Secondary | ICD-10-CM

## 2020-12-07 MED ORDER — LOSARTAN POTASSIUM 25 MG PO TABS: 25.0000 mg | ORAL_TABLET | Freq: Every day | ORAL | 0 refills | Status: DC

## 2020-12-07 NOTE — Progress Notes (Signed)
Virtual Visit Consent   Glenn Rodriguez, you are scheduled for a virtual visit with a [facility] provider today.     Just as with appointments in the office, your consent must be obtained to participate.  Your consent will be active for this visit and any virtual visit you may have with one of our providers in the next 365 days.     If you have a MyChart account, a copy of this consent can be sent to you electronically.  All virtual visits are billed to your insurance company just like a traditional visit in the office.    As this is a virtual visit, video technology does not allow for your provider to perform a traditional examination.  This may limit your provider's ability to fully assess your condition.  If your provider identifies any concerns that need to be evaluated in person or the need to arrange testing (such as labs, EKG, etc.), we will make arrangements to do so.     Although advances in technology are sophisticated, we cannot ensure that it will always work on either your end or our end.  If the connection with a video visit is poor, the visit may have to be switched to a telephone visit.  With either a video or telephone visit, we are not always able to ensure that we have a secure connection.     I need to obtain your verbal consent now.   Are you willing to proceed with your visit today?    Glenn Rodriguez has provided verbal consent on 12/07/2020 for a virtual visit (video or telephone).   [name], PA-C   Date: 12/07/2020 10:05 AM   Virtual Visit via Video Note   I, [name], connected with  Glenn Rodriguez  ([id number], 08-10-1975) on 12/07/20 at 10:00 AM EDT by a video-enabled telemedicine application and verified that I am speaking with the correct person using two identifiers.  Location: Patient: Virtual Visit Location Patient: Home Provider: Virtual Visit Location Provider: Home Office   I discussed the limitations of evaluation and management by  telemedicine and the availability of in person appointments. The patient expressed understanding and agreed to proceed.    History of Present Illness: Glenn Rodriguez is a 45 y.o. who identifies as a male who was assigned male at birth, and is being seen today for blood pressure. Has been having elevated BP readings over last few years. Saw eye doctor and was told there were some microvessel changes noted associated with high blood pressure. Has tried to limit salt intake in diet and has lost about 10 pounds but continues to have elevated BP. 135/80 lowest, this morning 145/99 then 150/101   Problems:  Patient Active Problem List   Diagnosis Date Noted   History of panic attacks 09/08/2018   Genital warts 07/09/2015   Allergic rhinitis 07/09/2015   Generalized anxiety disorder 02/24/2010    Allergies:  Allergies  Allergen Reactions   Amoxicillin Other (See Comments)    Childhood reaction-unknown   Anesthetics, Halogenated Other (See Comments)    Unknown reaction   Sertraline     Suicidal ideation   Venlafaxine Hcl     Suicidal ideation   Medications:  Current Outpatient Medications:    losartan (COZAAR) 25 MG tablet, Take 1 tablet (25 mg total) by mouth daily., Disp: 30 tablet, Rfl: 0   azelastine (OPTIVAR) 0.05 % ophthalmic solution, Place 1 drop into both eyes 2 (two) times  daily., Disp: 6 mL, Rfl: 1   clonazePAM (KLONOPIN) 0.5 MG tablet, TAKE 1 TABLET(0.5 MG) BY MOUTH EVERY MORNING, Disp: 30 tablet, Rfl: 5  Observations/Objective: Patient is well-developed, well-nourished in no acute distress.  Resting comfortably at home.  Head is normocephalic, atraumatic.  No labored breathing.  Speech is clear and coherent with logical content.  Patient is alert and oriented at baseline.    Assessment and Plan: 1. Primary hypertension - losartan (COZAAR) 25 MG tablet; Take 1 tablet (25 mg total) by mouth daily.  Dispense: 30 tablet; Refill: 0  - Will start Losartan - Continue DASH  diet - F/U PCP in 3-4 weeks for BP recheck and labs  Follow Up Instructions: I discussed the assessment and treatment plan with the patient. The patient was provided an opportunity to ask questions and all were answered. The patient agreed with the plan and demonstrated an understanding of the instructions.  A copy of instructions were sent to the patient via MyChart unless otherwise noted below.    The patient was advised to call back or seek an in-person evaluation if the symptoms worsen or if the condition fails to improve as anticipated.  Time:  I spent 13 minutes with the patient via telehealth technology discussing the above problems/concerns.    [name], PA-C

## 2020-12-07 NOTE — Patient Instructions (Signed)
Glenn Rodriguez, thank you for joining [name], PA-C for today's virtual visit.  While this provider is not your primary care provider (PCP), if your PCP is located in our provider database this encounter information will be shared with them immediately following your visit.  Consent: (Patient) Glenn Rodriguez provided verbal consent for this virtual visit at the beginning of the encounter.  Current Medications:  Current Outpatient Medications:    losartan (COZAAR) 25 MG tablet, Take 1 tablet (25 mg total) by mouth daily., Disp: 30 tablet, Rfl: 0   azelastine (OPTIVAR) 0.05 % ophthalmic solution, Place 1 drop into both eyes 2 (two) times daily., Disp: 6 mL, Rfl: 1   clonazePAM (KLONOPIN) 0.5 MG tablet, TAKE 1 TABLET(0.5 MG) BY MOUTH EVERY MORNING, Disp: 30 tablet, Rfl: 5   Medications ordered in this encounter:  Meds ordered this encounter  Medications   losartan (COZAAR) 25 MG tablet    Sig: Take 1 tablet (25 mg total) by mouth daily.    Dispense:  30 tablet    Refill:  0    Order Specific Question:   Supervising Provider    Answer:   Hyacinth Meeker, BRIAN [[id number]]     *If you need refills on other medications prior to your next appointment, please contact your pharmacy*  Follow-Up: Call back or seek an in-person evaluation if the symptoms worsen or if the condition fails to improve as anticipated.  Other Instructions Losartan Tablets What is this medication? LOSARTAN (loe SAR tan) treats high blood pressure. It may also be used to prevent a stroke in people with heart disease and high blood pressure. It can be used to prevent kidney damage in people with diabetes. It works by relaxing the blood vessels, which helps decrease the amount of work your heart has to do. It belongs to a group of medications called ARBs. This medicine may be used for other purposes; ask your health care provider or pharmacist if you have questions. COMMON BRAND NAME(S): Cozaar What should I tell my care  team before I take this medication? They need to know if you have any of these conditions: Heart failure Kidney disease Liver disease An unusual or allergic reaction to losartan, other medications, foods, dyes, or preservatives Pregnant or trying to get pregnant Breast-feeding How should I use this medication? Take this medication by mouth. Take it as directed on the prescription label at the same time every day. You can take it with or without food. If it upsets your stomach, take it with food. Keep taking it unless your care team tells you to stop. Talk to your care team about the use of this medication in children. While it may be prescribed for children as young as 6 for selected conditions, precautions do apply. Overdosage: If you think you have taken too much of this medicine contact a poison control center or emergency room at once. NOTE: This medicine is only for you. Do not share this medicine with others. What if I miss a dose? If you miss a dose, take it as soon as you can. If it is almost time for your next dose, take only that dose. Do not take double or extra doses. What may interact with this medication? Aliskiren ACE inhibitors, like enalapril or lisinopril Diuretics, especially amiloride, eplerenone, spironolactone, or triamterene Lithium NSAIDs, medications for pain and inflammation, like ibuprofen or naproxen Potassium salts or potassium supplements This list may not describe all possible interactions. Give your health care provider  a list of all the medicines, herbs, non-prescription drugs, or dietary supplements you use. Also tell them if you smoke, drink alcohol, or use illegal drugs. Some items may interact with your medicine. What should I watch for while using this medication? Visit your care team for regular check ups. Check your blood pressure as directed. Ask your care team what your blood pressure should be. Also, find out when you should contact them. Do not  treat yourself for coughs, colds, or pain while you are using this medication without asking your care team for advice. Some medications may increase your blood pressure. Women should inform their care team if they wish to become pregnant or think they might be pregnant. There is a potential for serious side effects to an unborn child. Talk to your care team for more information. You may get drowsy or dizzy. Do not drive, use machinery, or do anything that needs mental alertness until you know how this medication affects you. Do not stand or sit up quickly, especially if you are an older patient. This reduces the risk of dizzy or fainting spells. Alcohol can make you more drowsy and dizzy. Avoid alcoholic drinks. Avoid salt substitutes unless you are told otherwise by your care team. What side effects may I notice from receiving this medication? Side effects that you should report to your care team as soon as possible: Allergic reactions-skin rash, itching, hives, swelling of the face, lips, tongue, or throat High potassium level-muscle weakness, fast or irregular heartbeat Kidney injury-decrease in the amount of urine, swelling of the ankles, hands, or feet Low blood pressure-dizziness, feeling faint or lightheaded, blurry vision Side effects that usually do not require medical attention (report to your care team if they continue or are bothersome): Dizziness Headache Runny or stuffy nose This list may not describe all possible side effects. Call your doctor for medical advice about side effects. You may report side effects to FDA at 1-800-FDA-1088. Where should I keep my medication? Keep out of the reach of children and pets. Store at room temperature between 20 and 25 degrees C (68 and 77 degrees F). Protect from light. Keep the container tightly closed. Get rid of any unused medication after the expiration date. To get rid of medications that are no longer needed or have expired: Take the  medication to a medication take-back program. Check with your pharmacy or law enforcement to find a location. If you cannot return the medication, check the label or package insert to see if the medication should be thrown out in the garbage or flushed down the toilet. If you are not sure, ask your care team. If it is safe to put in the trash, empty the medication out of the container. Mix the medication with cat litter, dirt, coffee grounds, or other unwanted substance. Seal the mixture in a bag or container. Put it in the trash. NOTE: This sheet is a summary. It may not cover all possible information. If you have questions about this medicine, talk to your doctor, pharmacist, or health care provider.  2022 Elsevier/Gold Standard (2020-01-04 13:49:17)    If you have been instructed to have an in-person evaluation today at a local Urgent Care facility, please use the link below. It will take you to a list of all of our available [facility], including address, phone number and hours of operation. Please do not delay care.  [facility]  If you or a family member do not have  a primary care provider, use the link below to schedule a visit and establish care. When you choose a [facility] primary care physician or advanced practice provider, you gain a long-term partner in health. Find a Primary Care Provider  Learn more about [facility]'s in-office and virtual care options: [facility] - Get Care Now 

## 2021-01-08 ENCOUNTER — Ambulatory Visit: Payer: 59 | Admitting: Family Medicine

## 2021-01-08 ENCOUNTER — Encounter: Payer: Self-pay | Admitting: Family Medicine

## 2021-01-08 ENCOUNTER — Telehealth (INDEPENDENT_AMBULATORY_CARE_PROVIDER_SITE_OTHER): Payer: 59 | Admitting: Family Medicine

## 2021-01-08 VITALS — BP 151/90 | Temp 98.6°F | Wt 197.0 lb

## 2021-01-08 DIAGNOSIS — I10 Essential (primary) hypertension: Secondary | ICD-10-CM

## 2021-01-08 DIAGNOSIS — J069 Acute upper respiratory infection, unspecified: Secondary | ICD-10-CM | POA: Diagnosis not present

## 2021-01-08 MED ORDER — LOSARTAN POTASSIUM 25 MG PO TABS: 25.0000 mg | ORAL_TABLET | Freq: Every day | ORAL | 1 refills | Status: DC

## 2021-01-08 NOTE — Progress Notes (Signed)
Established patient visit   Patient: Glenn Rodriguez   DOB: 09/29/1975   45 y.o. Male  MRN: [id number] Visit Date: 01/08/2021  Today's healthcare provider: Mila Merry, MD   No chief complaint on file.  Subjective    URI  This is a new problem. The current episode started yesterday. The problem has been gradually worsening. There has been no fever. Associated symptoms include congestion, coughing, headaches, a plugged ear sensation, sinus pain and a sore throat. Pertinent negatives include no abdominal pain, ear pain, joint swelling, nausea, rhinorrhea or wheezing.    Hypertension, follow-up  BP Readings from Last 3 Encounters:  11/09/20 (!) 137/102  11/11/19 (!) 123/91  12/10/18 (!) 127/92   Wt Readings from Last 3 Encounters:  11/09/20 202 lb (91.6 kg)  11/11/19 191 lb (86.6 kg)  12/10/18 180 lb (81.6 kg)     He was last seen for hypertension 1  month  ago.  Management since that visit includes started losartan.  He reports excellent compliance with treatment. He is not having side effects.  He does not smoke.  Use of agents associated with hypertension: none.   Outside blood pressures are 130-140's/80-85. Symptoms: No chest pain No chest pressure  No palpitations No syncope  No dyspnea No orthopnea  No paroxysmal nocturnal dyspnea No lower extremity edema   Pertinent labs: No results found for: CHOL, HDL, LDLCALC, LDLDIRECT, TRIG, CHOLHDL Lab Results  Component Value Date   NA 138 10/14/2015   K 4.0 10/14/2015   CREATININE 1.16 10/14/2015   GFRNONAA >60 10/14/2015   GLUCOSE 107 (H) 10/14/2015   TSH 0.578 10/19/2015     The ASCVD Risk score (Arnett DK, et al., 2019) failed to calculate for the following reasons:   Cannot find a previous HDL lab   Cannot find a previous total cholesterol lab   ---------------------------------------------------------------------------------------------------    Medications: Outpatient Medications Prior to Visit   Medication Sig   azelastine (OPTIVAR) 0.05 % ophthalmic solution Place 1 drop into both eyes 2 (two) times daily.   clonazePAM (KLONOPIN) 0.5 MG tablet TAKE 1 TABLET(0.5 MG) BY MOUTH EVERY MORNING   losartan (COZAAR) 25 MG tablet Take 1 tablet (25 mg total) by mouth daily.   No facility-administered medications prior to visit.    Review of Systems  Constitutional:  Positive for fatigue. Negative for activity change, appetite change, chills, diaphoresis, fever and unexpected weight change.  HENT:  Positive for congestion, sinus pressure, sinus pain and sore throat. Negative for ear pain, postnasal drip, rhinorrhea and trouble swallowing.   Eyes: Negative.   Respiratory:  Positive for cough. Negative for apnea, choking, chest tightness, shortness of breath, wheezing and stridor.   Cardiovascular: Negative.   Gastrointestinal: Negative.  Negative for abdominal pain and nausea.  Musculoskeletal:  Positive for myalgias.  Neurological:  Positive for headaches. Negative for dizziness and light-headedness.      Objective     Physical Exam  Awake, alert, oriented x 3. In no apparent distress   No results found for any visits on 01/08/21.  Assessment & Plan     1. Primary hypertension Doing well with initiation of low dose ARB with no adverse effects. BP a little higher today likely due to URI.   continue losartan (COZAAR) 25 MG tablet; Take 1 tablet (25 mg total) by mouth daily.  Dispense: 90 tablet; Refill: 1  2. Upper respiratory tract infection, unspecified type Is improving. Counseled of s/s secondary bacterial infection and  to call if any developw.    In office follow up in 3 months.      The entirety of the information documented in the History of Present Illness, Review of Systems and Physical Exam were personally obtained by me. Portions of this information were initially documented by the CMA and reviewed by me for thoroughness and accuracy.     Mila Merry, MD   [facility] [phone] (phone) [phone] (fax)  [facility]

## 2021-03-09 ENCOUNTER — Telehealth: Payer: 59 | Admitting: Nurse Practitioner

## 2021-03-09 DIAGNOSIS — B354 Tinea corporis: Secondary | ICD-10-CM | POA: Diagnosis not present

## 2021-03-09 MED ORDER — TERBINAFINE HCL 1 % EX CREA: 1.0000 "application " | TOPICAL_CREAM | Freq: Two times a day (BID) | CUTANEOUS | 0 refills | Status: DC

## 2021-03-09 NOTE — Progress Notes (Signed)
E Visit for Rash  We are sorry that you are not feeling well. Here is how we plan to help!   Based upon your presentation it appears you have a fungal infection.  I have prescribed: Lamisl has been prescribed- use 2x a day- if does n ot start to improve- needs to be seen face to face  HOME CARE:  Take cool showers and avoid direct sunlight. Apply cool compress or wet dressings. Take a bath in an oatmeal bath.  Sprinkle content of one Aveeno packet under running faucet with comfortably warm water.  Bathe for 15-20 minutes, 1-2 times daily.  Pat dry with a towel. Do not rub the rash. Use hydrocortisone cream. Take an antihistamine like Benadryl for widespread rashes that itch.  The adult dose of Benadryl is 25-50 mg by mouth 4 times daily. Caution:  This type of medication may cause sleepiness.  Do not drink alcohol, drive, or operate dangerous machinery while taking antihistamines.  Do not take these medications if you have prostate enlargement.  Read package instructions thoroughly on all medications that you take.  GET HELP RIGHT AWAY IF:  Symptoms don't go away after treatment. Severe itching that persists. If you rash spreads or swells. If you rash begins to smell. If it blisters and opens or develops a yellow-brown crust. You develop a fever. You have a sore throat. You become short of breath.  MAKE SURE YOU:  Understand these instructions. Will watch your condition. Will get help right away if you are not doing well or get worse.  Thank you for choosing an e-visit.  Your e-visit answers were reviewed by a board certified advanced clinical practitioner to complete your personal care plan. Depending upon the condition, your plan could have included both over the counter or prescription medications.  Please review your pharmacy choice. Make sure the pharmacy is open so you can pick up prescription now. If there is a problem, you may contact your provider through [employer] and have the prescription routed to another pharmacy.  Your safety is important to [place]. If you have drug allergies check your prescription carefully.   For the next 24 hours you can use MyChart to ask questions about today's visit, request a non-urgent call back, or ask for a work or school excuse. You will get an email in the next two days asking about your experience. I hope that your e-visit has been valuable and will speed your recovery.  5-10 minutes spent reviewing and documenting in chart.

## 2021-03-11 ENCOUNTER — Ambulatory Visit: Payer: Self-pay | Admitting: *Deleted

## 2021-03-11 NOTE — Telephone Encounter (Signed)
Can have an acute visit slot, or can see Glenn Rodriguez or Glenn Rodriguez.

## 2021-03-11 NOTE — Telephone Encounter (Signed)
°  Chief Complaint: painful rash Symptoms: painful rash- upper in thigh- not responding to treatment Frequency: 2 weeks Pertinent Negatives: Patient denies fever, itching Disposition: [] ED /[] Urgent Care (no appt availability in office) / [] Appointment(In office/virtual)/ []  [facility]/ [] Home Care/ [] Refused Recommended Disposition /[]  Mobile Bus/ []  Follow-up with PCP Additional Notes: Patient has had virtual for this- request PCP appointment- call sent for review

## 2021-03-11 NOTE — Telephone Encounter (Signed)
Reason for Disposition  [1] Localized rash is very painful AND [2] no fever  Answer Assessment - Initial Assessment Questions 1. APPEARANCE of RASH: "Describe the rash."      Dark skin hue in center- red raised inflammation- 6x6 inner thigh- left 2. LOCATION: "Where is the rash located?"      Left upper inner thigh 3. NUMBER: "How many spots are there?"      Large area 4. SIZE: "How big are the spots?" (Inches, centimeters or compare to size of a coin)      Large rash 5. ONSET: "When did the rash start?"      2 weeks 6. ITCHING: "Does the rash itch?" If Yes, ask: "How bad is the itch?"  (Scale 0-10; or none, mild, moderate, severe)     no 7. PAIN: "Does the rash hurt?" If Yes, ask: "How bad is the pain?"  (Scale 0-10; or none, mild, moderate, severe)    - NONE (0): no pain    - MILD (1-3): doesn't interfere with normal activities     - MODERATE (4-7): interferes with normal activities or awakens from sleep     - SEVERE (8-10): excruciating pain, unable to do any normal activities     Moderate/severe 8. OTHER SYMPTOMS: "Do you have any other symptoms?" (e.g., fever)     *No Answer* 9. PREGNANCY: "Is there any chance you are pregnant?" "When was your last menstrual period?"     *No Answer*  Protocols used: Rash or Redness - Localized-A-AH

## 2021-03-12 ENCOUNTER — Other Ambulatory Visit: Payer: Self-pay

## 2021-03-12 ENCOUNTER — Encounter: Payer: Self-pay | Admitting: Physician Assistant

## 2021-03-12 ENCOUNTER — Ambulatory Visit (INDEPENDENT_AMBULATORY_CARE_PROVIDER_SITE_OTHER): Payer: 59 | Admitting: Physician Assistant

## 2021-03-12 VITALS — BP 137/91 | HR 65 | Temp 98.1°F | Wt 200.0 lb

## 2021-03-12 DIAGNOSIS — R21 Rash and other nonspecific skin eruption: Secondary | ICD-10-CM | POA: Diagnosis not present

## 2021-03-12 DIAGNOSIS — L03116 Cellulitis of left lower limb: Secondary | ICD-10-CM | POA: Diagnosis not present

## 2021-03-12 MED ORDER — DOXYCYCLINE HYCLATE 100 MG PO TABS: 100.0000 mg | ORAL_TABLET | Freq: Two times a day (BID) | ORAL | 0 refills | Status: AC

## 2021-03-12 NOTE — Progress Notes (Signed)
Established patient visit   Patient: Glenn Rodriguez   DOB: 15-May-1975   46 y.o. Male  MRN: [id number] Visit Date: 03/12/2021  Today's healthcare provider: Alfredia Ferguson, PA-C   Chief Complaint  Patient presents with   Rash   Subjective     [name] is a 46 y/o male who presents today with concerns over a rash on his left upper thigh x 1 month. He states it initially was a red round painful area, tried triamcinalone cream w/ no improvement, had a telehealth visit where terbinafine was rx, took for three days and no improvement. Redness and pain has worsened, has been using topical lidocaine for pain relief. Was also given prednisone with limited improvement, but after finishing, returned. Denies knowledge of bug bite. Denies fevers/chills/numbness.  Medications: Outpatient Medications Prior to Visit  Medication Sig   azelastine (OPTIVAR) 0.05 % ophthalmic solution Place 1 drop into both eyes 2 (two) times daily.   clonazePAM (KLONOPIN) 0.5 MG tablet TAKE 1 TABLET(0.5 MG) BY MOUTH EVERY MORNING   losartan (COZAAR) 25 MG tablet Take 1 tablet (25 mg total) by mouth daily.   terbinafine (LAMISIL AT) 1 % cream Apply 1 application topically 2 (two) times daily.   No facility-administered medications prior to visit.    Review of Systems  Constitutional:  Negative for fatigue.  HENT:  Negative for congestion.   Respiratory:  Negative for cough and shortness of breath.   Gastrointestinal:  Negative for diarrhea.  Skin:  Positive for rash. Negative for color change, pallor and wound.      Objective    BP (!) 137/91 (BP Location: Left Arm, Patient Position: Sitting, Cuff Size: Large)    Pulse 65    Temp 98.1 F (36.7 C) (Oral)    Wt 200 lb (90.7 kg)    SpO2 100%    BMI 28.70 kg/m    Physical Exam Constitutional:      Appearance: He is not ill-appearing.  HENT:     Head: Normocephalic.  Eyes:     Conjunctiva/sclera: Conjunctivae normal.  Cardiovascular:     Rate and Rhythm:  Normal rate.  Pulmonary:     Effort: Pulmonary effort is normal. No respiratory distress.  Skin:    Comments: Erythematous annular patch on left upper thigh w/ scaly central clearing. Associated slight edema of surrounding skin. No evidence of puncture or bite. Blanches.  Neurological:     General: No focal deficit present.     Mental Status: He is alert and oriented to person, place, and time.  Psychiatric:        Mood and Affect: Mood normal.        Behavior: Behavior normal.     No results found for any visits on 03/12/21.  Assessment & Plan     Rash, unspecified 2. Cellulitis Rx doxycyline Ref to Derm as appears to be similar to ringworm, but did not improve w/ tx, and now appears similar to erythema migrans but history does not match. There is a photo from his telehealth visit of the area and it only appeared to be a small erythematous patch, now progressing w/ ass skin edema  Return if symptoms worsen or fail to improve.      I, Glenn Ferguson, PA-C have reviewed all documentation for this visit. The documentation on  03/12/2021 for the exam, diagnosis, procedures, and orders are all accurate and complete.    Glenn Ferguson, PA-C  [facility] [phone] (phone) [phone] (  fax)  [facility]

## 2021-03-14 ENCOUNTER — Other Ambulatory Visit: Payer: Self-pay

## 2021-03-14 ENCOUNTER — Ambulatory Visit (INDEPENDENT_AMBULATORY_CARE_PROVIDER_SITE_OTHER): Payer: 59 | Admitting: Dermatology

## 2021-03-14 DIAGNOSIS — A6920 Lyme disease, unspecified: Secondary | ICD-10-CM | POA: Diagnosis not present

## 2021-03-14 MED ORDER — DOXYCYCLINE HYCLATE 100 MG PO TABS: ORAL_TABLET | ORAL | 0 refills | Status: DC

## 2021-03-14 NOTE — Progress Notes (Signed)
° °  New Patient Visit  Subjective  Glenn Rodriguez is a 46 y.o. male who presents for the following: Rash (Check a rash on the left leg started 2 weeks ago, pt taking Doxycycline 100 mg, using triamcinolone cream with a poor, past treatment Prednisone tablets helped but came back worse when he stopped ). Pt does not recall being bite by a insect/tick.   The following portions of the chart were reviewed this encounter and updated as appropriate:   Tobacco   Allergies   Meds   Problems   Med Hx   Surg Hx   Fam Hx      Review of Systems:  No other skin or systemic complaints except as noted in HPI or Assessment and Plan.  Objective  Well appearing patient in no apparent distress; mood and affect are within normal limits.  A focused examination was performed including left leg. Relevant physical exam findings are noted in the Assessment and Plan.  Left Thigh - Anterior Annular pink red macule    Assessment & Plan  Lyme disease -with erythema chronicum migrans Left Thigh - Anterior   Doxycyline 100 mg take 1 tablet twice a day with food x 1 month   Doxycycline should be taken with food to prevent nausea. Do not lay down for 30 minutes after taking. Be cautious with sun exposure and use good sun protection while on this medication. Pregnant women should not take this medication.    Return to the office if symptoms worsen or fail to improve    Related Medications doxycycline (VIBRA-TABS) 100 MG tablet Take 1 tablet twice a day with food  Return if symptoms worsen or fail to improve.  IAngelique Holm, CMA, am acting as scribe for Armida Sans, MD .  Documentation: I have reviewed the above documentation for accuracy and completeness, and I agree with the above.  Armida Sans, MD

## 2021-03-14 NOTE — Patient Instructions (Signed)

## 2021-03-19 ENCOUNTER — Encounter: Payer: Self-pay | Admitting: Dermatology

## 2021-04-16 ENCOUNTER — Other Ambulatory Visit: Payer: Self-pay

## 2021-04-16 ENCOUNTER — Ambulatory Visit (INDEPENDENT_AMBULATORY_CARE_PROVIDER_SITE_OTHER): Payer: 59 | Admitting: Family Medicine

## 2021-04-16 ENCOUNTER — Encounter: Payer: Self-pay | Admitting: Family Medicine

## 2021-04-16 VITALS — BP 138/82 | HR 83 | Temp 98.6°F | Resp 16 | Ht 70.0 in | Wt 203.7 lb

## 2021-04-16 DIAGNOSIS — I10 Essential (primary) hypertension: Secondary | ICD-10-CM | POA: Diagnosis not present

## 2021-04-16 NOTE — Patient Instructions (Signed)
.   Please review the attached list of medications and notify my office if there are any errors.   . Please bring all of your medications to every appointment so we can make sure that our medication list is the same as yours.   

## 2021-04-16 NOTE — Progress Notes (Signed)
Established patient visit   Patient: Glenn Rodriguez   DOB: 03-Nov-1975   46 y.o. Male  MRN: [id number] Visit Date: 04/16/2021  Today's healthcare provider: Lelon Huh, MD   Chief Complaint  Patient presents with   Hypertension   Subjective    HPI  Hypertension, follow-up  BP Readings from Last 3 Encounters:  04/16/21 138/82  03/12/21 (!) 137/91  01/08/21 (!) 151/90   Wt Readings from Last 3 Encounters:  04/16/21 203 lb 11.2 oz (92.4 kg)  03/12/21 200 lb (90.7 kg)  01/08/21 197 lb (89.4 kg)     He was last seen for hypertension in a virtual visit 3 months ago.  BP at that visit was 151/90. Management since that visit includes starting Losartan - Continue DASH diet - F/U PCP in 3-4 weeks for BP recheck and labs He reports excellent compliance with treatment. He is not having side effects.  He is following a Low Sodium diet. He is exercising.   Outside blood pressures are 130's-140's/90's.In the evening sometimes they are 120's/70's  Pertinent labs: No results found for: CHOL, HDL, LDLCALC, LDLDIRECT, TRIG, CHOLHDL Lab Results  Component Value Date   NA 138 10/14/2015   K 4.0 10/14/2015   CREATININE 1.16 10/14/2015   GFRNONAA >60 10/14/2015   GLUCOSE 107 (H) 10/14/2015   TSH 0.578 10/19/2015     The ASCVD Risk score (Arnett DK, et al., 2019) failed to calculate for the following reasons:   Cannot find a previous HDL lab   Cannot find a previous total cholesterol lab   ---------------------------------------------------------------------------------------------------  Medications: Outpatient Medications Prior to Visit  Medication Sig   azelastine (OPTIVAR) 0.05 % ophthalmic solution Place 1 drop into both eyes 2 (two) times daily.   clonazePAM (KLONOPIN) 0.5 MG tablet TAKE 1 TABLET(0.5 MG) BY MOUTH EVERY MORNING   doxycycline (VIBRA-TABS) 100 MG tablet Take 1 tablet twice a day with food   losartan (COZAAR) 25 MG tablet Take 1 tablet (25 mg total) by  mouth daily.   terbinafine (LAMISIL AT) 1 % cream Apply 1 application topically 2 (two) times daily.   No facility-administered medications prior to visit.    Review of Systems  All other systems reviewed and are negative.      Objective    BP 138/82 (BP Location: Left Arm, Patient Position: Sitting, Cuff Size: Large)    Pulse 83    Temp 98.6 F (37 C) (Oral)    Resp 16    Ht 5\' 10"  (1.778 m)    Wt 203 lb 11.2 oz (92.4 kg)    BMI 29.23 kg/m     Physical Exam   General: Appearance:     Overweight male in no acute distress  Eyes:    PERRL, conjunctiva/corneas clear, EOM's intact       Lungs:     Clear to auscultation bilaterally, respirations unlabored  Heart:    Normal heart rate. Normal rhythm. No murmurs, rubs, or gallops.    MS:   All extremities are intact.    Neurologic:   Awake, alert, oriented x 3. No apparent focal neurological defect.         Assessment & Plan     1. Primary hypertension Much better on low dose of losartan and lifestyle changes.  - Comprehensive metabolic panel - Lipid panel - TSH  He requested test for blood typing. He was advised that insurance would likely not cover this test but he is willing to  pay out of pocket if that's the case.  - ABO AND RH        The entirety of the information documented in the History of Present Illness, Review of Systems and Physical Exam were personally obtained by me. Portions of this information were initially documented by the CMA and reviewed by me for thoroughness and accuracy.     Lelon Huh, MD  [facility] [phone] (phone) [phone] (fax)  [place]

## 2021-04-18 LAB — LIPID PANEL
Chol/HDL Ratio: 5.2 ratio — ABNORMAL HIGH (ref 0.0–5.0)
Cholesterol, Total: 222 mg/dL — ABNORMAL HIGH (ref 100–199)
HDL: 43 mg/dL (ref >39)
LDL Chol Calc (NIH): 107 mg/dL — ABNORMAL HIGH (ref 0–99)
Triglycerides: 421 mg/dL — ABNORMAL HIGH (ref 0–149)
VLDL Cholesterol Cal: 72 mg/dL — ABNORMAL HIGH (ref 5–40)

## 2021-04-18 LAB — COMPREHENSIVE METABOLIC PANEL
ALT: 59 IU/L — ABNORMAL HIGH (ref 0–44)
AST: 32 IU/L (ref 0–40)
Albumin/Globulin Ratio: 1.8 (ref 1.2–2.2)
Albumin: 4.9 g/dL (ref 4.0–5.0)
Alkaline Phosphatase: 78 IU/L (ref 44–121)
BUN/Creatinine Ratio: 21 — ABNORMAL HIGH (ref 9–20)
BUN: 22 mg/dL (ref 6–24)
Bilirubin Total: 0.3 mg/dL (ref 0.0–1.2)
CO2: 22 mmol/L (ref 20–29)
Calcium: 10.2 mg/dL (ref 8.7–10.2)
Chloride: 103 mmol/L (ref 96–106)
Creatinine, Ser: 1.06 mg/dL (ref 0.76–1.27)
Globulin, Total: 2.7 g/dL (ref 1.5–4.5)
Glucose: 99 mg/dL (ref 70–99)
Potassium: 4.9 mmol/L (ref 3.5–5.2)
Sodium: 144 mmol/L (ref 134–144)
Total Protein: 7.6 g/dL (ref 6.0–8.5)
eGFR: 89 mL/min/{1.73_m2} (ref >59)

## 2021-04-18 LAB — ANTIBODY SCREEN: Antibody Screen: NEGATIVE

## 2021-04-18 LAB — TSH: TSH: 1.19 u[IU]/mL (ref 0.450–4.500)

## 2021-04-18 LAB — ABO AND RH: Rh Factor: NEGATIVE

## 2021-05-20 ENCOUNTER — Other Ambulatory Visit: Payer: Self-pay | Admitting: Family Medicine

## 2021-05-20 DIAGNOSIS — Z8659 Personal history of other mental and behavioral disorders: Secondary | ICD-10-CM

## 2021-05-20 DIAGNOSIS — F411 Generalized anxiety disorder: Secondary | ICD-10-CM

## 2021-05-21 ENCOUNTER — Other Ambulatory Visit: Payer: Self-pay | Admitting: Family Medicine

## 2021-05-21 DIAGNOSIS — F411 Generalized anxiety disorder: Secondary | ICD-10-CM

## 2021-05-21 DIAGNOSIS — Z8659 Personal history of other mental and behavioral disorders: Secondary | ICD-10-CM

## 2021-06-17 ENCOUNTER — Telehealth: Payer: 59 | Admitting: Physician Assistant

## 2021-06-17 DIAGNOSIS — Z91038 Other insect allergy status: Secondary | ICD-10-CM

## 2021-06-17 MED ORDER — TRIAMCINOLONE ACETONIDE 0.1 % EX CREA: 1.0000 "application " | TOPICAL_CREAM | Freq: Two times a day (BID) | CUTANEOUS | 0 refills | Status: DC

## 2021-06-17 MED ORDER — PREDNISONE 10 MG (21) PO TBPK: ORAL_TABLET | ORAL | 0 refills | Status: DC

## 2021-06-17 NOTE — Patient Instructions (Signed)
?  [name], thank you for joining Piedad Climes, PA-C for today's virtual visit.  While this provider is not your primary care provider (PCP), if your PCP is located in our provider database this encounter information will be shared with them immediately following your visit. ? ?Consent: ?(Patient) Glenn Rodriguez provided verbal consent for this virtual visit at the beginning of the encounter. ? ?Current Medications: ? ?Current Outpatient Medications:  ?  predniSONE (STERAPRED UNI-PAK 21 TAB) 10 MG (21) TBPK tablet, Take following package directions, Disp: 21 tablet, Rfl: 0 ?  triamcinolone cream (KENALOG) 0.1 %, Apply 1 application. topically 2 (two) times daily., Disp: 30 g, Rfl: 0 ?  clonazePAM (KLONOPIN) 0.5 MG tablet, TAKE 1 TABLET BY MOUTH EVERY MORNING, Disp: 30 tablet, Rfl: 3 ?  doxycycline (VIBRA-TABS) 100 MG tablet, Take 1 tablet twice a day with food, Disp: 60 tablet, Rfl: 0 ?  losartan (COZAAR) 25 MG tablet, Take 1 tablet (25 mg total) by mouth daily., Disp: 90 tablet, Rfl: 1  ? ?Medications ordered in this encounter:  ?Meds ordered this encounter  ?Medications  ? triamcinolone cream (KENALOG) 0.1 %  ?  Sig: Apply 1 application. topically 2 (two) times daily.  ?  Dispense:  30 g  ?  Refill:  0  ?  Order Specific Question:   Supervising Provider  ?  Answer:   Eber Hong [[id number]]  ? predniSONE (STERAPRED UNI-PAK 21 TAB) 10 MG (21) TBPK tablet  ?  Sig: Take following package directions  ?  Dispense:  21 tablet  ?  Refill:  0  ?  Order Specific Question:   Supervising Provider  ?  Answer:   Eber Hong [[id number]]  ?  ? ?*If you need refills on other medications prior to your next appointment, please contact your pharmacy* ? ?Follow-Up: ?Call back or seek an in-person evaluation if the symptoms worsen or if the condition fails to improve as anticipated. ? ?Other Instructions ?Please keep the skin clean and dry. ?Apply cold compresses as needed. ?Take the Prednisone and use the triamcinolone as  directed. ?Ok to continue OTC Benadryl. ? ?If you note any worsened redness, pain or hardness at any of the sites despite treatment, please let us know ASAP.  ? ?If you have been instructed to have an in-person evaluation today at a local Urgent Care facility, please use the link below. It will take you to a list of all of our available [facility], including address, phone number and hours of operation. Please do not delay care.  ?[facility] ? ?If you or a family member do not have a primary care provider, use the link below to schedule a visit and establish care. When you choose a [facility] primary care physician or advanced practice provider, you gain a long-term partner in health. ?Find a Primary Care Provider ? ?Learn more about [facility]'s in-office and virtual care options: ?[facility] - Get Care Now  ?

## 2021-06-17 NOTE — Progress Notes (Signed)
?Virtual Visit Consent  ? ?Glenn Rodriguez, you are scheduled for a virtual visit with a [facility] provider today.   ?  ?Just as with appointments in the office, your consent must be obtained to participate.  Your consent will be active for this visit and any virtual visit you may have with one of our providers in the next 365 days.   ?  ?If you have a MyChart account, a copy of this consent can be sent to you electronically.  All virtual visits are billed to your insurance company just like a traditional visit in the office.   ? ?As this is a virtual visit, video technology does not allow for your provider to perform a traditional examination.  This may limit your provider's ability to fully assess your condition.  If your provider identifies any concerns that need to be evaluated in person or the need to arrange testing (such as labs, EKG, etc.), we will make arrangements to do so.   ?  ?Although advances in technology are sophisticated, we cannot ensure that it will always work on either your end or our end.  If the connection with a video visit is poor, the visit may have to be switched to a telephone visit.  With either a video or telephone visit, we are not always able to ensure that we have a secure connection.    ? ?Also, by engaging in this virtual visit, you consent to the provision of healthcare. Additionally, you authorize for your insurance to be billed (if applicable) for the services provided during this visit.  ? ?I need to obtain your verbal consent now.   Are you willing to proceed with your visit today?  ?  ?Glenn Rodriguez has provided verbal consent on 06/17/2021 for a virtual visit (video or telephone). ?  ?[name], PA-C  ? ?Date: 06/17/2021 11:09 AM ? ? ?Virtual Visit via Video Note  ? ?ILeeanne Rio, connected with  Glenn Rodriguez  ([id number], Oct 29, 1975) on 06/17/21 at 11:00 AM EDT by a video-enabled telemedicine application and verified that I am speaking with the correct  person using two identifiers. ? ?Location: ?Patient: Virtual Visit Location Patient: Home ?Provider: Virtual Visit Location Provider: Home Office ?  ?I discussed the limitations of evaluation and management by telemedicine and the availability of in person appointments. The patient expressed understanding and agreed to proceed.   ? ?History of Present Illness: ?Glenn Rodriguez is a 46 y.o. who identifies as a male who was assigned male at birth, and is being seen today for ant stings/bites occurring yesterday around midday while he was cutting trees. Got into an ant nest. Got stung a few times on his legs bilaterally, one upper thigh and quad of R leg. Also a few on his left oblique right above the belt line. All are painful. One on his side is quite swollen and pruritic, some discomfort. Denies breathing issues, rash, or racing heart. Denies fevers, chills. [place] itself has been hard but denies any surrounding induration.  ? ? ? ? ?HPI: HPI  ?Problems:  ?Patient Active Problem List  ? Diagnosis Date Noted  ? History of panic attacks 09/08/2018  ? Genital warts 07/09/2015  ? Allergic rhinitis 07/09/2015  ? Generalized anxiety disorder 02/24/2010  ?  ?Allergies:  ?Allergies  ?Allergen Reactions  ? Amoxicillin Other (See Comments)  ?  Childhood reaction-unknown  ? Anesthetics, Halogenated Other (See Comments)  ?  Unknown reaction  ?  Sertraline   ?  Suicidal ideation  ? Venlafaxine Hcl   ?  Suicidal ideation  ? ?Medications:  ?Current Outpatient Medications:  ?  predniSONE (STERAPRED UNI-PAK 21 TAB) 10 MG (21) TBPK tablet, Take following package directions, Disp: 21 tablet, Rfl: 0 ?  triamcinolone cream (KENALOG) 0.1 %, Apply 1 application. topically 2 (two) times daily., Disp: 30 g, Rfl: 0 ?  clonazePAM (KLONOPIN) 0.5 MG tablet, TAKE 1 TABLET BY MOUTH EVERY MORNING, Disp: 30 tablet, Rfl: 3 ?  doxycycline (VIBRA-TABS) 100 MG tablet, Take 1 tablet twice a day with food, Disp: 60 tablet, Rfl: 0 ?  losartan (COZAAR) 25 MG  tablet, Take 1 tablet (25 mg total) by mouth daily., Disp: 90 tablet, Rfl: 1 ? ?Observations/Objective: ?Patient is well-developed, well-nourished in no acute distress.  ?Resting comfortably at home.  ?Head is normocephalic, atraumatic.  ?No labored breathing. ?Speech is clear and coherent with logical content.  ?Patient is alert and oriented at baseline.  ?Unable to fully visualize to do video.  ? ?Assessment and Plan: ?1. Allergy to ant bite ?- triamcinolone cream (KENALOG) 0.1 %; Apply 1 application. topically 2 (two) times daily.  Dispense: 30 g; Refill: 0 ?- predniSONE (STERAPRED UNI-PAK 21 TAB) 10 MG (21) TBPK tablet; Take following package directions  Dispense: 21 tablet; Refill: 0 ? ?No alarm signs or symptoms. Discussed signs/symptoms of infection and he will notify us if these occur so we can add antibiotic. Keep skin clean and dry. Can continue benadryl. Start prednisone taper. Triamcinolone per orders.  ? ?Follow Up Instructions: ?I discussed the assessment and treatment plan with the patient. The patient was provided an opportunity to ask questions and all were answered. The patient agreed with the plan and demonstrated an understanding of the instructions.  A copy of instructions were sent to the patient via MyChart unless otherwise noted below.  ? ?The patient was advised to call back or seek an in-person evaluation if the symptoms worsen or if the condition fails to improve as anticipated. ? ?Time:  ?I spent 12 minutes with the patient via telehealth technology discussing the above problems/concerns.   ? ?[name], PA-C ?

## 2021-06-30 ENCOUNTER — Other Ambulatory Visit: Payer: Self-pay | Admitting: Family Medicine

## 2021-06-30 DIAGNOSIS — I10 Essential (primary) hypertension: Secondary | ICD-10-CM

## 2021-09-17 ENCOUNTER — Encounter: Payer: Self-pay | Admitting: Family Medicine

## 2021-09-17 ENCOUNTER — Other Ambulatory Visit: Payer: Self-pay | Admitting: Family Medicine

## 2021-09-17 DIAGNOSIS — Z8659 Personal history of other mental and behavioral disorders: Secondary | ICD-10-CM

## 2021-09-17 DIAGNOSIS — I10 Essential (primary) hypertension: Secondary | ICD-10-CM | POA: Insufficient documentation

## 2021-09-17 DIAGNOSIS — F411 Generalized anxiety disorder: Secondary | ICD-10-CM

## 2021-10-17 ENCOUNTER — Other Ambulatory Visit: Payer: Self-pay | Admitting: Family Medicine

## 2021-10-17 DIAGNOSIS — F411 Generalized anxiety disorder: Secondary | ICD-10-CM

## 2021-10-17 DIAGNOSIS — Z8659 Personal history of other mental and behavioral disorders: Secondary | ICD-10-CM

## 2021-10-17 NOTE — Telephone Encounter (Signed)
Requested medication (s) are due for refill today - yes  Requested medication (s) are on the active medication list -yes  Future visit scheduled -yes  Last refill: 09/17/21 #30  Notes to clinic: non delegated Rx  Requested Prescriptions  Pending Prescriptions Disp Refills   clonazePAM (KLONOPIN) 0.5 MG tablet [Pharmacy Med Name: CLONAZEPAM 0.5MG  TABLETS] 30 tablet     Sig: TAKE 1 TABLET BY MOUTH EVERY MORNING     Not Delegated - Psychiatry: Anxiolytics/Hypnotics 2 Failed - 10/17/2021  7:30 AM      Failed - This refill cannot be delegated      Failed - Urine Drug Screen completed in last 360 days      Failed - Valid encounter within last 6 months    Recent Outpatient Visits           6 months ago Primary hypertension   [facility] Malva Limes, MD   7 months ago Cellulitis of left lower extremity   [facility] Alfredia Ferguson, PA-C   9 months ago Upper respiratory tract infection, unspecified type   [facility] Malva Limes, MD   11 months ago Generalized anxiety disorder   [facility] Fisher, Demetrios Isaacs, MD   1 year ago Generalized anxiety disorder   [facility] Fisher, Demetrios Isaacs, MD       Future Appointments             In 2 weeks Fisher, Demetrios Isaacs, MD [facility]            Passed - Patient is not pregnant         Requested Prescriptions  Pending Prescriptions Disp Refills   clonazePAM (KLONOPIN) 0.5 MG tablet [Pharmacy Med Name: CLONAZEPAM 0.5MG  TABLETS] 30 tablet     Sig: TAKE 1 TABLET BY MOUTH EVERY MORNING     Not Delegated - Psychiatry: Anxiolytics/Hypnotics 2 Failed - 10/17/2021  7:30 AM      Failed - This refill cannot be delegated      Failed - Urine Drug Screen completed in last 360 days      Failed - Valid encounter within last 6 months    Recent Outpatient Visits           6 months ago Primary hypertension   [facility]  Malva Limes, MD   7 months ago Cellulitis of left lower extremity   [facility] Alfredia Ferguson, PA-C   9 months ago Upper respiratory tract infection, unspecified type   [facility] Malva Limes, MD   11 months ago Generalized anxiety disorder   [facility] Malva Limes, MD   1 year ago Generalized anxiety disorder   [facility] Fisher, Demetrios Isaacs, MD       Future Appointments             In 2 weeks Fisher, Demetrios Isaacs, MD [facility]            Passed - Patient is not pregnant

## 2021-11-01 ENCOUNTER — Ambulatory Visit (INDEPENDENT_AMBULATORY_CARE_PROVIDER_SITE_OTHER): Payer: 59 | Admitting: Family Medicine

## 2021-11-01 ENCOUNTER — Encounter: Payer: Self-pay | Admitting: Family Medicine

## 2021-11-01 VITALS — BP 130/88 | HR 75 | Temp 98.1°F | Wt 207.0 lb

## 2021-11-01 DIAGNOSIS — I10 Essential (primary) hypertension: Secondary | ICD-10-CM

## 2021-11-01 MED ORDER — LOSARTAN POTASSIUM 50 MG PO TABS: 50.0000 mg | ORAL_TABLET | Freq: Every day | ORAL | 0 refills | Status: DC

## 2021-11-01 NOTE — Patient Instructions (Signed)
Please review the attached list of medications and notify my office if there are any errors.   Please go to the lab draw station in [address] on the second floor of [facility]. Normal hours are 8:00am to 11:30am and 1:00pm to 4:00pm Monday through Friday  

## 2021-11-01 NOTE — Progress Notes (Unsigned)
I,Roshena L Chambers,acting as a scribe for Lelon Huh, MD.,have documented all relevant documentation on the behalf of Lelon Huh, MD,as directed by  Lelon Huh, MD while in the presence of Lelon Huh, MD.    Established patient visit   Patient: Glenn Rodriguez   DOB: [birth year]   46 y.o. Male  MRN: [id number] Visit Date: 11/01/2021  Today's healthcare provider: Lelon Huh, MD   Chief Complaint  Patient presents with   Hypertension   Anxiety   Subjective    HPI  Hypertension, follow-up  BP Readings from Last 3 Encounters:  11/01/21 (!) 124/93  04/16/21 138/82  03/12/21 (!) 137/91   Wt Readings from Last 3 Encounters:  11/01/21 207 lb (93.9 kg)  04/16/21 203 lb 11.2 oz (92.4 kg)  03/12/21 200 lb (90.7 kg)     He was last seen for hypertension 6 months ago.  BP at that visit was 138/82. Management since that visit includes continue same medication.  He reports good compliance with treatment. He is having side effects. (Blood pressure was running high for the past 2 months, so patient increased his dose from Losartan 74m daily to 562mdaily) He is following a Regular diet. He is not exercising. He does not smoke.  Use of agents associated with hypertension: none.   Outside blood pressures are 120-130/ 70-80. Symptoms: No chest pain No chest pressure  No palpitations No syncope  No dyspnea No orthopnea  No paroxysmal nocturnal dyspnea No lower extremity edema   Pertinent labs Lab Results  Component Value Date   CHOL 222 (H) 04/16/2021   HDL 43 04/16/2021   LDLCALC 107 (H) 04/16/2021   TRIG 421 (H) 04/16/2021   CHOLHDL 5.2 (H) 04/16/2021   Lab Results  Component Value Date   NA 144 04/16/2021   K 4.9 04/16/2021   CREATININE 1.06 04/16/2021   EGFR 89 04/16/2021   GLUCOSE 99 04/16/2021   TSH 1.190 04/16/2021     The 10-year ASCVD risk score (Arnett DK, et al., 2019) is:  2.6%  ---------------------------------------------------------------------------------------------------   Anxiety, Follow-up  He was last seen for anxiety 1  year  ago. Changes made at last visit include continue same medication.   He reports good compliance with treatment. He reports good tolerance of treatment. He is not having side effects.   He feels his anxiety is mild and Unchanged since last visit.  Symptoms: No chest pain No difficulty concentrating  No dizziness No fatigue  No feelings of losing control No insomnia  No irritable No palpitations  No panic attacks No racing thoughts  No shortness of breath No sweating  No tremors/shakes    GAD-7 Results    11/09/2020    2:31 PM 11/11/2019    3:51 PM  GAD-7 Generalized Anxiety Disorder Screening Tool  1. Feeling Nervous, Anxious, or on Edge 1 0  2. Not Being Able to Stop or Control Worrying 0 0  3. Worrying Too Much About Different Things 2 0  4. Trouble Relaxing 0 0  5. Being So Restless it's Hard To Sit Still 0 0  6. Becoming Easily Annoyed or Irritable 1 1  7. Feeling Afraid As If Something Awful Might Happen 0 0  Total GAD-7 Score 4 1  Difficulty At Work, Home, or Getting  Along With Others? Somewhat difficult Not difficult at all    PHQ-9 Scores    11/01/2021    4:25 PM 03/12/2021    1:35 PM 11/09/2020  2:29 PM  PHQ9 SCORE ONLY  PHQ-9 Total Score 0 2 2    ---------------------------------------------------------------------------------------------------   Medications: Outpatient Medications Prior to Visit  Medication Sig   clonazePAM (KLONOPIN) 0.5 MG tablet TAKE 1 TABLET BY MOUTH EVERY MORNING   losartan (COZAAR) 25 MG tablet TAKE 1 TABLET(25 MG) BY MOUTH DAILY (Patient taking differently: Take 50 mg by mouth daily.)   triamcinolone cream (KENALOG) 0.1 % Apply 1 application. topically 2 (two) times daily.   [DISCONTINUED] doxycycline (VIBRA-TABS) 100 MG tablet Take 1 tablet twice a day with food  (Patient not taking: Reported on 11/01/2021)   No facility-administered medications prior to visit.    Review of Systems  Constitutional:  Negative for appetite change, chills and fever.  Respiratory:  Negative for chest tightness, shortness of breath and wheezing.   Cardiovascular:  Negative for chest pain and palpitations.  Gastrointestinal:  Negative for abdominal pain, nausea and vomiting.    {Labs  Heme  Chem  Endocrine  Serology  Results Review (optional):23779}   Objective    BP (!) 124/93 (BP Location: Left Arm, Patient Position: Sitting, Cuff Size: Large)   Pulse 75   Temp 98.1 F (36.7 C) (Oral)   Wt 207 lb (93.9 kg)   SpO2 100% Comment: room air  BMI 29.70 kg/m  {Show previous vital signs (optional):23777}  Today's Vitals   11/01/21 1617 11/01/21 1621 11/01/21 1645  BP: (!) 129/102 (!) 124/93 130/88  Pulse: 75    Temp: 98.1 F (36.7 C)    TempSrc: Oral    SpO2: 100%    Weight: 207 lb (93.9 kg)     Body mass index is 29.7 kg/m.   Physical Exam  General appearance: Well developed, well nourished male, cooperative and in no acute distress Head: Normocephalic, without obvious abnormality, atraumatic Respiratory: Respirations even and unlabored, normal respiratory rate Extremities: All extremities are intact.  Skin: Skin color, texture, turgor normal. No rashes seen  Psych: Appropriate mood and affect. Neurologic: Mental status: Alert, oriented to person, place, and time, thought content appropriate.   Assessment & Plan     1. Primary hypertension Doing well since doubling up on losartan to 2 x 55m a day.   - Renal function panel  - losartan (COZAAR) 50 MG tablet; Take 1 tablet (50 mg total) by mouth daily.  Dispense: 90 tablet; Refill: 0   He declined flu vaccine today.      The entirety of the information documented in the History of Present Illness, Review of Systems and Physical Exam were personally obtained by me. Portions of this information  were initially documented by the CMA and reviewed by me for thoroughness and accuracy.     DLelon Huh MD  [phone](phone) [phone](fax)  [place]

## 2021-11-09 LAB — RENAL FUNCTION PANEL
Albumin: 4.7 g/dL (ref 4.1–5.1)
BUN/Creatinine Ratio: 18 (ref 9–20)
BUN: 17 mg/dL (ref 6–24)
CO2: 21 mmol/L (ref 20–29)
Calcium: 9.8 mg/dL (ref 8.7–10.2)
Chloride: 103 mmol/L (ref 96–106)
Creatinine, Ser: 0.97 mg/dL (ref 0.76–1.27)
Glucose: 82 mg/dL (ref 70–99)
Phosphorus: 3.6 mg/dL (ref 2.8–4.1)
Potassium: 4.5 mmol/L (ref 3.5–5.2)
Sodium: 140 mmol/L (ref 134–144)
eGFR: 99 mL/min/{1.73_m2} (ref >59)

## 2021-11-13 ENCOUNTER — Telehealth: Payer: Self-pay

## 2021-11-13 MED ORDER — CLONAZEPAM 1 MG PO TABS: 0.5000 mg | ORAL_TABLET | Freq: Every morning | ORAL | 0 refills | Status: DC

## 2021-11-13 NOTE — Telephone Encounter (Signed)
Copied from [place] [id number]. Topic: General - Other >> Nov 13, 2021  2:41 PM Ja-Kwan M wrote: Reason for CRM: Pt reports that the clonazePAM (KLONOPIN) 0.5 MG tablet is on backorder but the pharmacy does have the clonazePAM (KLONOPIN) 1 MG tablet. Pt requests that a new Rx for the 1 mg tablet with instructions to cut it in half be sent to his pharmacy. Pt also stated that he was informed that his pharmacy will be sending in this request.

## 2021-12-12 ENCOUNTER — Other Ambulatory Visit: Payer: Self-pay | Admitting: Family Medicine

## 2021-12-12 DIAGNOSIS — Z91038 Other insect allergy status: Secondary | ICD-10-CM

## 2021-12-13 ENCOUNTER — Other Ambulatory Visit: Payer: Self-pay | Admitting: Family Medicine

## 2021-12-13 DIAGNOSIS — I10 Essential (primary) hypertension: Secondary | ICD-10-CM

## 2021-12-13 NOTE — Telephone Encounter (Signed)
Requested Prescriptions  Pending Prescriptions Disp Refills  . losartan (COZAAR) 25 MG tablet [Pharmacy Med Name: LOSARTAN 25MG  TABLETS] 90 tablet 0    Sig: TAKE 1 TABLET(25 MG) BY MOUTH DAILY     Cardiovascular:  Angiotensin Receptor Blockers Passed - 12/13/2021  3:28 AM      Passed - Cr in normal range and within 180 days    Creatinine  Date Value Ref Range Status  03/25/2014 1.00 0.60 - 1.30 mg/dL Final   Creatinine, Ser  Date Value Ref Range Status  11/08/2021 0.97 0.76 - 1.27 mg/dL Final         Passed - K in normal range and within 180 days    Potassium  Date Value Ref Range Status  11/08/2021 4.5 3.5 - 5.2 mmol/L Final  03/25/2014 3.3 (L) 3.5 - 5.1 mmol/L Final         Passed - Patient is not pregnant      Passed - Last BP in normal range    BP Readings from Last 1 Encounters:  11/01/21 130/88         Passed - Valid encounter within last 6 months    Recent Outpatient Visits          1 month ago Primary hypertension   [facility] Birdie Sons, MD   8 months ago Primary hypertension   [facility] Birdie Sons, MD   9 months ago Cellulitis of left lower extremity   [facility] Mikey Kirschner, PA-C   11 months ago Upper respiratory tract infection, unspecified type   [facility] Birdie Sons, MD   1 year ago Generalized anxiety disorder   [place], Kirstie Peri, MD

## 2022-01-21 ENCOUNTER — Other Ambulatory Visit: Payer: Self-pay | Admitting: Family Medicine

## 2022-01-21 DIAGNOSIS — I10 Essential (primary) hypertension: Secondary | ICD-10-CM

## 2022-01-22 ENCOUNTER — Ambulatory Visit: Payer: 59 | Admitting: Physician Assistant

## 2022-03-10 ENCOUNTER — Other Ambulatory Visit: Payer: Self-pay | Admitting: Family Medicine

## 2022-03-10 DIAGNOSIS — F411 Generalized anxiety disorder: Secondary | ICD-10-CM

## 2022-03-10 DIAGNOSIS — Z8659 Personal history of other mental and behavioral disorders: Secondary | ICD-10-CM

## 2022-05-09 ENCOUNTER — Telehealth: Payer: 59 | Admitting: Physician Assistant

## 2022-05-09 DIAGNOSIS — B9689 Other specified bacterial agents as the cause of diseases classified elsewhere: Secondary | ICD-10-CM

## 2022-05-09 DIAGNOSIS — J019 Acute sinusitis, unspecified: Secondary | ICD-10-CM | POA: Diagnosis not present

## 2022-05-09 MED ORDER — CEFDINIR 300 MG PO CAPS: 300.0000 mg | ORAL_CAPSULE | Freq: Two times a day (BID) | ORAL | 0 refills | Status: DC

## 2022-05-09 MED ORDER — FLUTICASONE PROPIONATE 50 MCG/ACT NA SUSP: 2.0000 | Freq: Every day | NASAL | 0 refills | Status: DC

## 2022-05-09 NOTE — Progress Notes (Signed)
Virtual Visit Consent   Glenn Rodriguez, you are scheduled for a virtual visit with a [facility] provider today. Just as with appointments in the office, your consent must be obtained to participate. Your consent will be active for this visit and any virtual visit you may have with one of our providers in the next 365 days. If you have a MyChart account, a copy of this consent can be sent to you electronically.  As this is a virtual visit, video technology does not allow for your provider to perform a traditional examination. This may limit your provider's ability to fully assess your condition. If your provider identifies any concerns that need to be evaluated in person or the need to arrange testing (such as labs, EKG, etc.), we will make arrangements to do so. Although advances in technology are sophisticated, we cannot ensure that it will always work on either your end or our end. If the connection with a video visit is poor, the visit may have to be switched to a telephone visit. With either a video or telephone visit, we are not always able to ensure that we have a secure connection.  By engaging in this virtual visit, you consent to the provision of healthcare and authorize for your insurance to be billed (if applicable) for the services provided during this visit. Depending on your insurance coverage, you may receive a charge related to this service.  I need to obtain your verbal consent now. Are you willing to proceed with your visit today? [name] has provided verbal consent on 05/09/2022 for a virtual visit (video or telephone). Glenn Rodriguez, [place]  Date: 05/09/2022 8:34 AM  Virtual Visit via Video Note   I, Glenn Rodriguez, connected with  ODAY LOTHIAN  ([id number], 09/14/1975) on 05/09/22 at  8:30 AM EDT by a video-enabled telemedicine application and verified that I am speaking with the correct person using two identifiers.  Location: Patient: Virtual Visit Location  Patient: Home Provider: Virtual Visit Location Provider: Home Office   I discussed the limitations of evaluation and management by telemedicine and the availability of in person appointments. The patient expressed understanding and agreed to proceed.    History of Present Illness: Glenn Rodriguez is a 47 y.o. who identifies as a male who was assigned male at birth, and is being seen today for concern for sinusitis after getting over COVID-19. Endorses with COVID symptoms around 3/4 with fatigue, rhinorrhea followed by congestion, fever, body aches. Tested positive for COVID during this time. Fever lasted for about 3-4 days and resolved. Some residual fatigue. Other symptoms resolving except for the sinus congestion. Now with worsening sinus congestion, sinus pressure and pain, worsening in past few days. Thick yellow phlegm.   OTC -- Tylenol, Mucinex 1200 mg ER.   HPI: HPI  Problems:  Patient Active Problem List   Diagnosis Date Noted   Hypertension 09/17/2021   History of panic attacks 09/08/2018   Genital warts 07/09/2015   Allergic rhinitis 07/09/2015   Generalized anxiety disorder 02/24/2010    Allergies:  Allergies  Allergen Reactions   Amoxicillin Other (See Comments)    Childhood reaction-unknown   Anesthetics, Halogenated Other (See Comments)    Unknown reaction   Sertraline     Suicidal ideation   Venlafaxine Hcl     Suicidal ideation   Medications:  Current Outpatient Medications:    cefdinir (OMNICEF) 300 MG capsule, Take 1 capsule (300 mg total) by mouth 2 (  two) times daily., Disp: 20 capsule, Rfl: 0   fluticasone (FLONASE) 50 MCG/ACT nasal spray, Place 2 sprays into both nostrils daily., Disp: 16 g, Rfl: 0   clonazePAM (KLONOPIN) 0.5 MG tablet, TAKE 1 TABLET BY MOUTH EVERY MORNING, Disp: 30 tablet, Rfl: 3   losartan (COZAAR) 50 MG tablet, TAKE 1 TABLET(50 MG) BY MOUTH DAILY, Disp: 90 tablet, Rfl: 3   triamcinolone cream (KENALOG) 0.1 %, APPLY TOPICALLY TO THE  AFFECTED AREA TWICE DAILY, Disp: 30 g, Rfl: 1  Observations/Objective: Patient is well-developed, well-nourished in no acute distress.  Resting comfortably at home.  Head is normocephalic, atraumatic.  No labored breathing. Speech is clear and coherent with logical content.  Patient is alert and oriented at baseline.   Assessment and Plan: 1. Acute bacterial sinusitis - fluticasone (FLONASE) 50 MCG/ACT nasal spray; Place 2 sprays into both nostrils daily.  Dispense: 16 g; Refill: 0 - cefdinir (OMNICEF) 300 MG capsule; Take 1 capsule (300 mg total) by mouth 2 (two) times daily.  Dispense: 20 capsule; Refill: 0  Rx Cefdinir (childhood reaction to Amox but tolerates this well. Also had bad intolerance to Doxy and prefers Cefdinir).  Increase fluids.  Rest.  Saline nasal spray.  Probiotic.  Mucinex as directed.  Humidifier in bedroom. Flonase per orders.  Call or return to clinic if symptoms are not improving.   Follow Up Instructions: I discussed the assessment and treatment plan with the patient. The patient was provided an opportunity to ask questions and all were answered. The patient agreed with the plan and demonstrated an understanding of the instructions.  A copy of instructions were sent to the patient via MyChart unless otherwise noted below.   The patient was advised to call back or seek an in-person evaluation if the symptoms worsen or if the condition fails to improve as anticipated.  Time:  I spent 10 minutes with the patient via telehealth technology discussing the above problems/concerns.    Glenn Rio, PA-C

## 2022-05-09 NOTE — Patient Instructions (Signed)
Earle Gell, thank you for joining [name], PA-C for today's virtual visit.  While this provider is not your primary care provider (PCP), if your PCP is located in our provider database this encounter information will be shared with them immediately following your visit.   [facility] account gives you access to today's visit and all your visits, tests, and labs performed at [facility] " click here if you don't have a [facility] account or go to [email or web address]  Consent: (Patient) ALDYN SWENOR provided verbal consent for this virtual visit at the beginning of the encounter.  Current Medications:  Current Outpatient Medications:    clonazePAM (KLONOPIN) 0.5 MG tablet, TAKE 1 TABLET BY MOUTH EVERY MORNING, Disp: 30 tablet, Rfl: 3   losartan (COZAAR) 50 MG tablet, TAKE 1 TABLET(50 MG) BY MOUTH DAILY, Disp: 90 tablet, Rfl: 3   triamcinolone cream (KENALOG) 0.1 %, APPLY TOPICALLY TO THE AFFECTED AREA TWICE DAILY, Disp: 30 g, Rfl: 1   Medications ordered in this encounter:  No orders of the defined types were placed in this encounter.    *If you need refills on other medications prior to your next appointment, please contact your pharmacy*  Follow-Up: Call back or seek an in-person evaluation if the symptoms worsen or if the condition fails to improve as anticipated.  [facility] [phone]  Other Instructions Please take antibiotic as directed.  Increase fluid intake.  Use Saline nasal spray.  Take a daily multivitamin. Start the [facility] as directed. Ok to continue OTC Mucinex. Can also start OTC antihistamine like Xyzal once daily. Place a humidifier in the bedroom.  Please call or return clinic if symptoms are not improving.  Sinusitis Sinusitis is redness, soreness, and swelling (inflammation) of the paranasal sinuses. Paranasal sinuses are air pockets within the bones of your face (beneath the eyes, the middle of  the forehead, or above the eyes). In healthy paranasal sinuses, mucus is able to drain out, and air is able to circulate through them by way of your nose. However, when your paranasal sinuses are inflamed, mucus and air can become trapped. This can allow bacteria and other germs to grow and cause infection. Sinusitis can develop quickly and last only a short time (acute) or continue over a long period (chronic). Sinusitis that lasts for more than 12 weeks is considered chronic.  CAUSES  Causes of sinusitis include: Allergies. Structural abnormalities, such as displacement of the cartilage that separates your nostrils (deviated septum), which can decrease the air flow through your nose and sinuses and affect sinus drainage. Functional abnormalities, such as when the small hairs (cilia) that line your sinuses and help remove mucus do not work properly or are not present. SYMPTOMS  Symptoms of acute and chronic sinusitis are the same. The primary symptoms are pain and pressure around the affected sinuses. Other symptoms include: Upper toothache. Earache. Headache. Bad breath. Decreased sense of smell and taste. A cough, which worsens when you are lying flat. Fatigue. Fever. Thick drainage from your nose, which often is green and may contain pus (purulent). Swelling and warmth over the affected sinuses. DIAGNOSIS  Your caregiver will perform a physical exam. During the exam, your caregiver may: Look in your nose for signs of abnormal growths in your nostrils (nasal polyps). Tap over the affected sinus to check for signs of infection. View the inside of your sinuses (endoscopy) with a special imaging device with a light attached (endoscope), which  is inserted into your sinuses. If your caregiver suspects that you have chronic sinusitis, one or more of the following tests may be recommended: Allergy tests. Nasal culture A sample of mucus is taken from your nose and sent to a lab and screened for  bacteria. Nasal cytology A sample of mucus is taken from your nose and examined by your caregiver to determine if your sinusitis is related to an allergy. TREATMENT  Most cases of acute sinusitis are related to a viral infection and will resolve on their own within 10 days. Sometimes medicines are prescribed to help relieve symptoms (pain medicine, decongestants, nasal steroid sprays, or saline sprays).  However, for sinusitis related to a bacterial infection, your caregiver will prescribe antibiotic medicines. These are medicines that will help kill the bacteria causing the infection.  Rarely, sinusitis is caused by a fungal infection. In theses cases, your caregiver will prescribe antifungal medicine. For some cases of chronic sinusitis, surgery is needed. Generally, these are cases in which sinusitis recurs more than 3 times per year, despite other treatments. HOME CARE INSTRUCTIONS  Drink plenty of water. Water helps thin the mucus so your sinuses can drain more easily. Use a humidifier. Inhale steam 3 to 4 times a day (for example, sit in the bathroom with the shower running). Apply a warm, moist washcloth to your face 3 to 4 times a day, or as directed by your caregiver. Use saline nasal sprays to help moisten and clean your sinuses. Take over-the-counter or prescription medicines for pain, discomfort, or fever only as directed by your caregiver. SEEK IMMEDIATE MEDICAL CARE IF: You have increasing pain or severe headaches. You have nausea, vomiting, or drowsiness. You have swelling around your face. You have vision problems. You have a stiff neck. You have difficulty breathing. MAKE SURE YOU:  Understand these instructions. Will watch your condition. Will get help right away if you are not doing well or get worse. Document Released: 02/10/2005 Document Revised: 05/05/2011 Document Reviewed: 02/25/2011 [facility] Patient Information 2014 [place], [place].    If you have been  instructed to have an in-person evaluation today at a local Urgent Care facility, please use the link below. It will take you to a list of all of our available [facility], including address, phone number and hours of operation. Please do not delay care.  [facility]  If you or a family member do not have a primary care provider, use the link below to schedule a visit and establish care. When you choose a [facility] primary care physician or advanced practice provider, you gain a long-term partner in health. Find a Primary Care Provider  Learn more about 's in-office and virtual care options: [facility]

## 2022-05-15 ENCOUNTER — Encounter: Payer: Self-pay | Admitting: Family Medicine

## 2022-05-16 ENCOUNTER — Telehealth: Payer: 59 | Admitting: Nurse Practitioner

## 2022-05-16 DIAGNOSIS — U071 COVID-19: Secondary | ICD-10-CM

## 2022-05-16 NOTE — Progress Notes (Signed)
Because you have had COVID and are not responding to first line antibiotics, I feel your condition warrants further evaluation and I recommend that you be seen in a face to face visit.  Additional testing or imaging may be needed at this time   NOTE: There will be NO CHARGE for this eVisit   If you are having a true medical emergency please call 911.      For an urgent face to face visit, [facility] has eight urgent care centers for your convenience:   NEW!! [facility] [place] [facility] [place] Get Driving Directions [id number] [address] [place], [place] [facility] [place] [facility] [place] Get Driving Directions [id number] [place], [place] [address]   [facility] [place] [facility]) Get Driving Directions [id number] [address], [place] [address]  [facility] [place]) Get Driving Directions [id number] [address] [place],  [place]  [address]  [facility] [place] [facility] at [place] Get Driving Directions  [phone] [address] [place],  [place] [address]   [facility] Get Driving Directions [id number] [address] [place], [place], [place] [address]   [facility] Get Driving Directions  [id number] [address] [place], [place] [address]   [facility] at [place] Get Driving Directions [id number] [address], [place], [place] [address]  Your MyChart E-visit questionnaire answers were reviewed by a board certified advanced clinical practitioner to complete your personal care plan based on your specific symptoms.  Thank you for using e-Visits.

## 2022-07-10 ENCOUNTER — Other Ambulatory Visit: Payer: Self-pay | Admitting: Family Medicine

## 2022-07-10 DIAGNOSIS — Z8659 Personal history of other mental and behavioral disorders: Secondary | ICD-10-CM

## 2022-07-10 DIAGNOSIS — F411 Generalized anxiety disorder: Secondary | ICD-10-CM

## 2022-08-31 ENCOUNTER — Telehealth: Payer: 59 | Admitting: Family

## 2022-08-31 DIAGNOSIS — Z20818 Contact with and (suspected) exposure to other bacterial communicable diseases: Secondary | ICD-10-CM

## 2022-08-31 DIAGNOSIS — J029 Acute pharyngitis, unspecified: Secondary | ICD-10-CM

## 2022-08-31 MED ORDER — AZITHROMYCIN 250 MG PO TABS: ORAL_TABLET | ORAL | 0 refills | Status: DC

## 2022-08-31 NOTE — Progress Notes (Signed)
Virtual Visit Consent   Glenn Rodriguez, you are scheduled for a virtual visit with a [facility] provider today. Just as with appointments in the office, your consent must be obtained to participate. Your consent will be active for this visit and any virtual visit you may have with one of our providers in the next 365 days. If you have a MyChart account, a copy of this consent can be sent to you electronically.  As this is a virtual visit, video technology does not allow for your provider to perform a traditional examination. This may limit your provider's ability to fully assess your condition. If your provider identifies any concerns that need to be evaluated in person or the need to arrange testing (such as labs, EKG, etc.), we will make arrangements to do so. Although advances in technology are sophisticated, we cannot ensure that it will always work on either your end or our end. If the connection with a video visit is poor, the visit may have to be switched to a telephone visit. With either a video or telephone visit, we are not always able to ensure that we have a secure connection.  By engaging in this virtual visit, you consent to the provision of healthcare and authorize for your insurance to be billed (if applicable) for the services provided during this visit. Depending on your insurance coverage, you may receive a charge related to this service.  I need to obtain your verbal consent now. Are you willing to proceed with your visit today? Glenn Rodriguez has provided verbal consent on 08/31/2022 for a virtual visit (video or telephone). Glenn Rodney, FNP  Date: 08/31/2022 9:29 AM  Virtual Visit via Video Note   I, Glenn Rodriguez, connected with  Glenn Rodriguez  ([id number], January 17, 1942) on 08/31/22 at  9:30 AM EDT by a video-enabled telemedicine application and verified that I am speaking with the correct person using two identifiers.  Location: Patient: Virtual Visit Location Patient:  Home Provider: Virtual Visit Location Provider: Home Office   I discussed the limitations of evaluation and management by telemedicine and the availability of in person appointments. The patient expressed understanding and agreed to proceed.    History of Present Illness: Glenn Rodriguez is a 47 y.o. who identifies as a male who was assigned male at birth, and is being seen today for sore throat that started 4 days ago. Reports his co-worker son has strep.   HPI: Sore Throat  This is a new problem. The current episode started in the past 7 days. The problem has been gradually worsening. The pain is worse on the right side. There has been no fever. The pain is at a severity of 7/10. Associated symptoms include swollen glands and trouble swallowing. Pertinent negatives include no congestion, coughing, ear pain, headaches or shortness of breath. Associated symptoms comments: Tonsil enlarged and erythemas . He has had exposure to strep. He has tried acetaminophen and NSAIDs for the symptoms. The treatment provided mild relief.    Problems:  Patient Active Problem List   Diagnosis Date Noted   Hypertension 09/17/2021   History of panic attacks 09/08/2018   Genital warts 07/09/2015   Allergic rhinitis 07/09/2015   Generalized anxiety disorder 02/24/2010    Allergies:  Allergies  Allergen Reactions   Amoxicillin Other (See Comments)    Childhood reaction-unknown   Anesthetics, Halogenated Other (See Comments)    Unknown reaction   Sertraline     Suicidal ideation   Venlafaxine  Hcl     Suicidal ideation   Medications:  Current Outpatient Medications:    azithromycin (ZITHROMAX) 250 MG tablet, Take 500 mg once, then 250 mg for four days, Disp: 6 tablet, Rfl: 0   clonazePAM (KLONOPIN) 0.5 MG tablet, TAKE 1 TABLET BY MOUTH EVERY MORNING, Disp: 30 tablet, Rfl: 3   losartan (COZAAR) 50 MG tablet, TAKE 1 TABLET(50 MG) BY MOUTH DAILY, Disp: 90 tablet, Rfl: 3   triamcinolone cream (KENALOG) 0.1 %,  APPLY TOPICALLY TO THE AFFECTED AREA TWICE DAILY, Disp: 30 g, Rfl: 1  Observations/Objective: Patient is well-developed, well-nourished in no acute distress.  Resting comfortably  at home.  Head is normocephalic, atraumatic.  No labored breathing.  Speech is clear and coherent with logical content.  Patient is alert and oriented at baseline.    Assessment and Plan: 1. Acute pharyngitis, unspecified etiology - azithromycin (ZITHROMAX) 250 MG tablet; Take 500 mg once, then 250 mg for four days  Dispense: 6 tablet; Refill: 0  2. Exposure to strep throat - azithromycin (ZITHROMAX) 250 MG tablet; Take 500 mg once, then 250 mg for four days  Dispense: 6 tablet; Refill: 0  - Take meds as prescribed - Use a cool mist humidifier  -Use saline nose sprays frequently -Force fluids -For any cough or congestion  Use plain Mucinex- regular strength or max strength is fine -For fever or aces or pains- take tylenol or ibuprofen. -Throat lozenges if help -New toothbrush in 3 days -Follow up if symptoms worsen or do not improve   Follow Up Instructions: I discussed the assessment and treatment plan with the patient. The patient was provided an opportunity to ask questions and all were answered. The patient agreed with the plan and demonstrated an understanding of the instructions.  A copy of instructions were sent to the patient via MyChart unless otherwise noted below.    The patient was advised to call back or seek an in-person evaluation if the symptoms worsen or if the condition fails to improve as anticipated.  Time:  I spent 7 minutes with the patient via telehealth technology discussing the above problems/concerns.    Glenn Rodney, FNP

## 2022-11-07 ENCOUNTER — Other Ambulatory Visit: Payer: Self-pay | Admitting: Family Medicine

## 2022-11-07 DIAGNOSIS — F411 Generalized anxiety disorder: Secondary | ICD-10-CM

## 2022-11-07 DIAGNOSIS — Z8659 Personal history of other mental and behavioral disorders: Secondary | ICD-10-CM

## 2022-11-17 ENCOUNTER — Other Ambulatory Visit: Payer: Self-pay | Admitting: Family Medicine

## 2022-11-17 DIAGNOSIS — Z91038 Other insect allergy status: Secondary | ICD-10-CM

## 2022-11-18 NOTE — Telephone Encounter (Signed)
Requested medication (s) are due for refill today: yes  Requested medication (s) are on the active medication list: yes  Last refill:  12/12/21  Future visit scheduled: yes  Notes to clinic:  Unable to refill per protocol, cannot delegate.      Requested Prescriptions  Pending Prescriptions Disp Refills   triamcinolone cream (KENALOG) 0.1 % [Pharmacy Med Name: TRIAMCINOLONE 0.1% CREAM   30GM] 30 g 1    Sig: APPLY TOPICALLY TO THE AFFECTED AREA TWICE DAILY     Not Delegated - Dermatology:  Corticosteroids Failed - 11/17/2022  6:57 PM      Failed - This refill cannot be delegated      Failed - Valid encounter within last 12 months    Recent Outpatient Visits           1 year ago Primary hypertension   [facility] Malva Limes, MD   1 year ago Primary hypertension   [facility] Malva Limes, MD   1 year ago Cellulitis of left lower extremity   [facility] Alfredia Ferguson, PA-C   1 year ago Upper respiratory tract infection, unspecified type   [facility] Malva Limes, MD   2 years ago Generalized anxiety disorder   [facility] Malva Limes, MD       Future Appointments             In 2 weeks Fisher, Demetrios Isaacs, MD [facility]

## 2022-11-30 ENCOUNTER — Telehealth: Payer: 59 | Admitting: Physician Assistant

## 2022-11-30 DIAGNOSIS — T63421A Toxic effect of venom of ants, accidental (unintentional), initial encounter: Secondary | ICD-10-CM | POA: Diagnosis not present

## 2022-11-30 MED ORDER — HYDROXYZINE HCL 10 MG PO TABS
10.0000 mg | ORAL_TABLET | Freq: Three times a day (TID) | ORAL | 0 refills | Status: AC | PRN
Start: 2022-11-30 — End: ?

## 2022-11-30 MED ORDER — PREDNISONE 10 MG PO TABS: ORAL_TABLET | ORAL | 0 refills | Status: DC

## 2022-11-30 NOTE — Progress Notes (Signed)
Virtual Visit Consent   Glenn Rodriguez, you are scheduled for a virtual visit with a [facility] provider today. Just as with appointments in the office, your consent must be obtained to participate. Your consent will be active for this visit and any virtual visit you may have with one of our providers in the next 365 days. If you have a MyChart account, a copy of this consent can be sent to you electronically.  As this is a virtual visit, video technology does not allow for your provider to perform a traditional examination. This may limit your provider's ability to fully assess your condition. If your provider identifies any concerns that need to be evaluated in person or the need to arrange testing (such as labs, EKG, etc.), we will make arrangements to do so. Although advances in technology are sophisticated, we cannot ensure that it will always work on either your end or our end. If the connection with a video visit is poor, the visit may have to be switched to a telephone visit. With either a video or telephone visit, we are not always able to ensure that we have a secure connection.  By engaging in this virtual visit, you consent to the provision of healthcare and authorize for your insurance to be billed (if applicable) for the services provided during this visit. Depending on your insurance coverage, you may receive a charge related to this service.  I need to obtain your verbal consent now. Are you willing to proceed with your visit today? Glenn Rodriguez has provided verbal consent on 11/30/2022 for a virtual visit (video or telephone). [name], PA-C  Date: 11/30/2022 3:07 PM  Virtual Visit via Video Note   I, [name], connected with  Glenn Rodriguez  ([id number], 01-30-1976) on 11/30/22 at  3:30 PM EDT by a video-enabled telemedicine application and verified that I am speaking with the correct person using two identifiers.  Location: Patient: Virtual Visit Location  Patient: Home Provider: Virtual Visit Location Provider: Home Office   I discussed the limitations of evaluation and management by telemedicine and the availability of in person appointments. The patient expressed understanding and agreed to proceed.    History of Present Illness: Glenn Rodriguez is a 47 y.o. who identifies as a male who was assigned male at birth, and is being seen today for asian needle ant stings/bites. Happened yesterday while working in a yard cleaning up some trees. Things he has about 15 stings on the left posterior upper leg, just above the knee, bites on the left knee and calf, and one on the neck. They are raised, red, and pruritic.   Problems:  Patient Active Problem List   Diagnosis Date Noted   Hypertension 09/17/2021   History of panic attacks 09/08/2018   Genital warts 07/09/2015   Allergic rhinitis 07/09/2015   Generalized anxiety disorder 02/24/2010    Allergies:  Allergies  Allergen Reactions   Amoxicillin Other (See Comments)    Childhood reaction-unknown   Anesthetics, Halogenated Other (See Comments)    Unknown reaction   Sertraline     Suicidal ideation   Venlafaxine Hcl     Suicidal ideation   Medications:  Current Outpatient Medications:    azithromycin (ZITHROMAX) 250 MG tablet, Take 500 mg once, then 250 mg for four days, Disp: 6 tablet, Rfl: 0   clonazePAM (KLONOPIN) 0.5 MG tablet, TAKE 1 TABLET BY MOUTH EVERY MORNING, Disp: 30 tablet, Rfl: 0   losartan (  COZAAR) 50 MG tablet, TAKE 1 TABLET(50 MG) BY MOUTH DAILY, Disp: 90 tablet, Rfl: 3   triamcinolone cream (KENALOG) 0.1 %, APPLY TOPICALLY TO THE AFFECTED AREA TWICE DAILY, Disp: 30 g, Rfl: 1  Observations/Objective: Patient is well-developed, well-nourished in no acute distress.  Resting comfortably at home.  Head is normocephalic, atraumatic.  No labored breathing.  Speech is clear and coherent with logical content.  Patient is alert and oriented at baseline.    Assessment and  Plan: There are no diagnoses linked to this encounter. - Ant bites with histamine reaction - Prednisone for inflammation and itching - Hydroxyzine for itching (drowsiness precautions discussed) - Cool compresses - Luke warm to cool showers - Try to limit itching, scratching, or picking to prevent secondary skin infection - Seek in person evaluation if worsening or fails to improve  Follow Up Instructions: I discussed the assessment and treatment plan with the patient. The patient was provided an opportunity to ask questions and all were answered. The patient agreed with the plan and demonstrated an understanding of the instructions.  A copy of instructions were sent to the patient via MyChart unless otherwise noted below.    The patient was advised to call back or seek an in-person evaluation if the symptoms worsen or if the condition fails to improve as anticipated.   [name], PA-C

## 2022-11-30 NOTE — Patient Instructions (Signed)
Glenn Rodriguez, thank you for joining Glenn Loveless, PA-C for today's virtual visit.  While this provider is not your primary care provider (PCP), if your PCP is located in our provider database this encounter information will be shared with them immediately following your visit.   A [facility] MyChart account gives you access to today's visit and all your visits, tests, and labs performed at [facility] " click here if you don't have a [facility] MyChart account or go to [email or web address]  Consent: (Patient) Glenn Rodriguez provided verbal consent for this virtual visit at the beginning of the encounter.  Current Medications:  Current Outpatient Medications:    hydrOXYzine (ATARAX) 10 MG tablet, Take 1 tablet (10 mg total) by mouth 3 (three) times daily as needed., Disp: 30 tablet, Rfl: 0   predniSONE (DELTASONE) 10 MG tablet, Days 1-4 take 4 tablets (40 mg) daily  Days 5-8 take 3 tablets (30 mg) daily, Days 9-11 take 2 tablets (20 mg) daily, Days 12-14 take 1 tablet (10 mg) daily., Disp: 37 tablet, Rfl: 0   azithromycin (ZITHROMAX) 250 MG tablet, Take 500 mg once, then 250 mg for four days, Disp: 6 tablet, Rfl: 0   clonazePAM (KLONOPIN) 0.5 MG tablet, TAKE 1 TABLET BY MOUTH EVERY MORNING, Disp: 30 tablet, Rfl: 0   losartan (COZAAR) 50 MG tablet, TAKE 1 TABLET(50 MG) BY MOUTH DAILY, Disp: 90 tablet, Rfl: 3   triamcinolone cream (KENALOG) 0.1 %, APPLY TOPICALLY TO THE AFFECTED AREA TWICE DAILY, Disp: 30 g, Rfl: 1   Medications ordered in this encounter:  Meds ordered this encounter  Medications   predniSONE (DELTASONE) 10 MG tablet    Sig: Days 1-4 take 4 tablets (40 mg) daily  Days 5-8 take 3 tablets (30 mg) daily, Days 9-11 take 2 tablets (20 mg) daily, Days 12-14 take 1 tablet (10 mg) daily.    Dispense:  37 tablet    Refill:  0    Order Specific Question:   Supervising Provider    Answer:   Merrilee Jansky [[id number]]   hydrOXYzine (ATARAX) 10 MG tablet     Sig: Take 1 tablet (10 mg total) by mouth 3 (three) times daily as needed.    Dispense:  30 tablet    Refill:  0    Order Specific Question:   Supervising Provider    Answer:   Merrilee Jansky [id number]     *If you need refills on other medications prior to your next appointment, please contact your pharmacy*  Follow-Up: Call back or seek an in-person evaluation if the symptoms worsen or if the condition fails to improve as anticipated.  [facility] [phone]  Other Instructions Insect Bite, Adult An insect bite can make your skin red, itchy, and swollen. An insect bite is different from an insect sting, which happens when an insect injects poison (venom) into the skin. Some insects can spread disease to people through a bite. However, most insect bites do not lead to disease and are not serious. What are the causes? Insects may bite for a variety of reasons, including: Hunger. To defend themselves. Insects that bite include: Spiders. Mosquitoes and flies. Ticks and fleas. Ants. Kissing bugs. Chiggers. What are the signs or symptoms? In many cases, symptoms last for 2-4 days. However, itching can last up to 10 days. Symptoms include: Itching or pain in the bite area. Redness and swelling in the bite area. An open wound (skin ulcer). In  rare cases, a person may have a severe allergic reaction (anaphylactic reaction) to a bite. Symptoms of an anaphylactic reaction may include: Feeling warm in the face (flushed). This may include redness. Itchy, red, swollen areas of skin (hives). Swelling of the eyes, lips, face, mouth, tongue, or throat. Wheezing or difficulty breathing, speaking, or swallowing. Dizziness, light-headedness, or fainting. Abdominal symptoms like cramping, nausea, vomiting, or diarrhea. How is this diagnosed? This condition is usually diagnosed based on symptoms and a physical exam. During the exam, your health care provider will look at the  bite and ask you what kind of insect bit you. How is this treated? Most insect bites are not serious. Symptoms often go away on their own and treatment is not usually needed. When treatment is recommended, it may include: Applying ice to the affected area. Applying steroid or other anti-itch creams, like calamine lotion, to the bite area. Medicines called antihistamines to reduce itching. You may also need: A tetanus shot if you are not up to date. Antibiotic cream or an oral antibiotic if the bite becomes infected (this is uncommon). Follow these instructions at home: Bite area care  Do not scratch the bite area. It may help to cover the bite area with a bandage or close-fitting clothing. Keep the bite area clean and dry. Wash it every day with soap and water as told by your health care provider. Check the bite area every day for signs of infection. Check for: More redness, swelling, or pain. Fluid or blood. Warmth. Pus or a bad smell. Managing pain, itching, and swelling  You may apply cortisone cream, calamine lotion, or a paste made of baking soda and water to the bite area as told by your health care provider. If directed, put ice on the bite area. To do this: Put ice in a plastic bag. Place a towel between your skin and the bag. Leave the ice on for 20 minutes, 2-3 times a day. If your skin turns bright red, remove the ice right away to prevent skin damage. The risk of skin damage is higher if you cannot feel pain, heat, or cold. General instructions Apply or take over-the-counter and prescription medicine only as told by your health care provider. If you were prescribed antibiotics, take or apply them as told by your health care provider. Do not stop using the antibiotic even if you start to feel better. How is this prevented? To help reduce your risk of insect bites: When you are outdoors, wear clothing that covers your arms and legs. This is especially important in the early  morning and evening. Use insect repellent. The best insect repellents contain DEET, picaridin, oil of lemon eucalyptus (OLE), or IR3535. Consider spraying your clothing with a pesticide called permethrin. Permethrin helps prevent insect bites. It works for several weeks and for up to 5-6 clothing washes. Do not apply permethrin directly to the skin. If your home windows do not have screens, consider installing them. If you will be sleeping in an area where there are mosquitoes, consider covering your sleeping area with a mosquito net. Contact a health care provider if: Your bite area has signs of infection, such as: More redness, swelling, or pain. Fluid or blood. Warmth. Pus or a bad smell. You have a fever. Get help right away if: You have a rash. You have muscle or joint pain. You feel unusually tired or weak. You have neck pain or a headache. You develop symptoms of an anaphylactic reaction. These may  include: Swelling of the eyes, lips, face, mouth, tongue, or throat. Flushed skin or hives. Wheezing. Difficulty breathing, speaking, or swallowing. Dizziness, light-headedness, or fainting. Abdominal pain, cramping, vomiting, or diarrhea. These symptoms may be an emergency. Get help right away. Call 911. Do not wait to see if the symptoms will go away. Do not drive yourself to the hospital. Summary An insect bite can make your skin red, itchy, and swollen. Treatment is usually not needed. Symptoms often go away on their own. When treatment is recommended, it may involve taking medicine, applying medicine to the area, or applying ice. Apply or take over-the-counter and prescription medicines only as told by your health care provider. Use insect repellent to help prevent insect bites. Contact a health care provider if your bite area has signs of infection. This information is not intended to replace advice given to you by your health care provider. Make sure you discuss any questions  you have with your health care provider. Document Revised: 05/22/2021 Document Reviewed: 05/07/2021 Elsevier Patient Education  2024 Elsevier Inc.    If you have been instructed to have an in-person evaluation today at a local Urgent Care facility, please use the link below. It will take you to a list of all of our available [facility], including address, phone number and hours of operation. Please do not delay care.  [facility]  If you or a family member do not have a primary care provider, use the link below to schedule a visit and establish care. When you choose a [facility] primary care physician or advanced practice provider, you gain a long-term partner in health. Find a Primary Care Provider  Learn more about [facility] in-office and virtual care options: [facility] - Get Care Now

## 2022-12-05 ENCOUNTER — Ambulatory Visit (INDEPENDENT_AMBULATORY_CARE_PROVIDER_SITE_OTHER): Payer: 59 | Admitting: Family Medicine

## 2022-12-05 VITALS — BP 153/103 | HR 64 | Ht 70.0 in | Wt 217.0 lb

## 2022-12-05 DIAGNOSIS — Z0000 Encounter for general adult medical examination without abnormal findings: Secondary | ICD-10-CM | POA: Diagnosis not present

## 2022-12-05 DIAGNOSIS — Z1159 Encounter for screening for other viral diseases: Secondary | ICD-10-CM

## 2022-12-05 DIAGNOSIS — Z1211 Encounter for screening for malignant neoplasm of colon: Secondary | ICD-10-CM

## 2022-12-05 DIAGNOSIS — I10 Essential (primary) hypertension: Secondary | ICD-10-CM

## 2022-12-05 NOTE — Progress Notes (Signed)
Complete physical exam   Patient: Glenn Rodriguez   DOB: February 02, 1976   47 y.o. Male  MRN: [id number] Visit Date: 12/05/2022  Today's healthcare provider: Mila Merry, MD   Chief Complaint  Patient presents with   Annual Exam   Subjective    Discussed the use of AI scribe software for clinical note transcription with the patient, who gave verbal consent to proceed.  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 He reports feeling stressed due to increased workload and staffing changes at his workplace. He has noticed an elevation in his blood pressure, which he attributes to his current stress levels.  The patient is on a daily regimen of Losartan 50mg  and Clonazepam 0.5mg . Recently, he was prescribed Prednisone and Hydroxyzine due to an allergic reaction from ant stings. The Prednisone effectively reduced the inflammation from the sting, and the patient decided to stop the medication yesterday since the inflammation subsided.  The patient admits to struggling with dietary restrictions, particularly avoiding salty foods. He has been trying to consume leaner meats and has stopped eating [employer] for lunch. However, he does not eat breakfast or lunch, only dinner. He has noticed a recent weight gain of about seven pounds, which he attributes to the Prednisone.  The patient also reports some hearing difficulties, particularly in environments with background noise. He attributes this to not wearing hearing protection when he was younger. He does not feel the need for hearing aids at this point.  The patient has been consuming alcohol more frequently than usual, sometimes having two to three beers in the evening after work. He is aware that this could be contributing to his elevated blood pressure.  The patient has a history of hemorrhoids, which occasionally bleed, but he has not noticed any blood in his stool or  any changes in stool color. He is aware of the importance of colon cancer screening but has not pursued it due to insurance coverage issues.         Past Medical History:  Diagnosis Date   Allergic rhinitis    Depression    History of genital warts    Situational stress    Past Surgical History:  Procedure Laterality Date   NO PAST SURGERIES     Social History   Socioeconomic History   Marital status: Married    Spouse name: Not on file   Number of children: Not on file   Years of education: Not on file   Highest education level: Not on file  Occupational History   Occupation: Event organiser: [employer]    Comment: [employer] as of 08/2018  Tobacco Use   Smoking status: Former    Current packs/day: 0.00    Types: Cigarettes    Quit date: 2002    Years since quitting: 22.7   Smokeless tobacco: Never   Tobacco comments:    Remote: Quit when he was about 24  Substance and Sexual Activity   Alcohol use: Yes    Alcohol/week: 0.0 standard drinks of alcohol    Comment: occasional    Drug use: No   Sexual activity: Yes  Other Topics Concern   Not on file  Social History Narrative   Not on file   Social Determinants of Health   Financial Resource Strain: Not on file  Food Insecurity: Not on file  Transportation Needs: Not on file  Physical  Activity: Not on file  Stress: Not on file  Social Connections: Not on file  Intimate Partner Violence: Not on file   Family Status  Relation Name Status   Mother  Alive  No partnership data on file   Family History  Problem Relation Age of Onset   Depression Mother    Allergies  Allergen Reactions   Amoxicillin Other (See Comments)    Childhood reaction-unknown   Anesthetics, Halogenated Other (See Comments)    Unknown reaction   Sertraline     Suicidal ideation   Venlafaxine Hcl     Suicidal ideation    Patient Care Team: Malva Limes, MD as PCP - General (Family Medicine)    Medications: Outpatient Medications Prior to Visit  Medication Sig   clonazePAM (KLONOPIN) 0.5 MG tablet TAKE 1 TABLET BY MOUTH EVERY MORNING   hydrOXYzine (ATARAX) 10 MG tablet Take 1 tablet (10 mg total) by mouth 3 (three) times daily as needed.   losartan (COZAAR) 50 MG tablet TAKE 1 TABLET(50 MG) BY MOUTH DAILY   triamcinolone cream (KENALOG) 0.1 % APPLY TOPICALLY TO THE AFFECTED AREA TWICE DAILY   [DISCONTINUED] azithromycin (ZITHROMAX) 250 MG tablet Take 500 mg once, then 250 mg for four days   predniSONE (DELTASONE) 10 MG tablet Days 1-4 take 4 tablets (40 mg) daily  Days 5-8 take 3 tablets (30 mg) daily, Days 9-11 take 2 tablets (20 mg) daily, Days 12-14 take 1 tablet (10 mg) daily. (Patient not taking: Reported on 12/05/2022)   No facility-administered medications prior to visit.    Review of Systems  Constitutional:  Negative for chills, diaphoresis and fever.  HENT:  Negative for congestion, ear discharge, ear pain, hearing loss, nosebleeds, sore throat and tinnitus.   Eyes:  Negative for photophobia, pain, discharge and redness.  Respiratory:  Negative for cough, shortness of breath, wheezing and stridor.   Cardiovascular:  Negative for chest pain, palpitations and leg swelling.  Gastrointestinal:  Negative for abdominal pain, blood in stool, constipation, diarrhea, nausea and vomiting.  Endocrine: Negative for polydipsia.  Genitourinary:  Negative for dysuria, flank pain, frequency, hematuria and urgency.  Musculoskeletal:  Negative for back pain, myalgias and neck pain.  Skin:  Negative for rash.  Allergic/Immunologic: Negative for environmental allergies.  Neurological:  Negative for dizziness, tremors, seizures, weakness and headaches.  Hematological:  Does not bruise/bleed easily.  Psychiatric/Behavioral:  Negative for hallucinations and suicidal ideas. The patient is nervous/anxious.       Objective    BP (!) 153/103 (BP Location: Left Arm, Patient Position:  Sitting, Cuff Size: Large)   Pulse 64   Ht 5\' 10"  (1.778 m)   Wt 217 lb (98.4 kg)   SpO2 100%   BMI 31.14 kg/m    Physical Exam  General Appearance:    Mildly obese male. Alert, cooperative, in no acute distress, appears stated age  Head:    Normocephalic, without obvious abnormality, atraumatic  Eyes:    PERRL, conjunctiva/corneas clear, EOM's intact, fundi    benign, both eyes       Ears:    Normal TM's and external ear canals, both ears  Nose:   Nares normal, septum midline, mucosa normal, no drainage   or sinus tenderness  Throat:   Lips, mucosa, and tongue normal; teeth and gums normal  Neck:   Supple, symmetrical, trachea midline, no adenopathy;       thyroid:  No enlargement/tenderness/nodules; no carotid   bruit or JVD  Back:  Symmetric, no curvature, ROM normal, no CVA tenderness  Lungs:     Clear to auscultation bilaterally, respirations unlabored  Chest wall:    No tenderness or deformity  Heart:    Normal heart rate. Normal rhythm. No murmurs, rubs, or gallops.  S1 and S2 normal  Abdomen:     Soft, non-tender, bowel sounds active all four quadrants,    no masses, no organomegaly  Genitalia:    deferred  Rectal:    deferred  Extremities:   All extremities are intact. No cyanosis or edema  Pulses:   2+ and symmetric all extremities  Skin:   Skin color, texture, turgor normal, no rashes or lesions  Lymph nodes:   Cervical, supraclavicular, and axillary nodes normal  Neurologic:   CNII-XII intact. Normal strength, sensation and reflexes      throughout       Last depression screening scores    12/05/2022    8:30 AM 11/01/2021    4:25 PM 03/12/2021    1:35 PM  PHQ 2/9 Scores  PHQ - 2 Score 0 0 0  PHQ- 9 Score 0 0 2   Last fall risk screening    03/12/2021    1:35 PM  Fall Risk   Falls in the past year? 0  Number falls in past yr: 0  Injury with Fall? 0  Risk for fall due to : No Fall Risks  Follow up Falls evaluation completed   Last Audit-C alcohol  use screening    03/12/2021    1:36 PM  Alcohol Use Disorder Test (AUDIT)  1. How often do you have a drink containing alcohol? 2  2. How many drinks containing alcohol do you have on a typical day when you are drinking? 1  3. How often do you have six or more drinks on one occasion? 0  AUDIT-C Score 3   A score of 3 or more in women, and 4 or more in men indicates increased risk for alcohol abuse, EXCEPT if all of the points are from question 1   No results found for any visits on 12/05/22.  Assessment & Plan    Routine Health Maintenance and Physical Exam  Exercise Activities and Dietary recommendations  Goals   None     Immunization History  Administered Date(s) Administered   Influenza Inj Mdck Quad Pf 11/02/2018, 11/16/2020, 12/06/2021   Influenza-Unspecified 12/11/2016   Tdap 06/23/2016    Health Maintenance  Topic Date Due   HIV Screening  Never done   Hepatitis C Screening  Never done   INFLUENZA VACCINE  09/25/2022   COVID-19 Vaccine (1 - 2023-24 season) Never done   Colonoscopy  Never done   DTaP/Tdap/Td (2 - Td or Tdap) 06/24/2026   HPV VACCINES  Aged Out    Discussed health benefits of physical activity, and encouraged him to engage in regular exercise appropriate for his age and condition.     Hypertension Elevated blood pressure likely due to stress and inconsistent diet. Currently on Losartan 50mg  daily. -Consider increasing Losartan to 100mg  daily pending blood work results. -Encourage stress management and reduction of dietary salt intake.  Anxiety Managed with Clonazepam 0.5mg  daily. Reports no severe panic attacks. -Continue Clonazepam 0.5mg  daily.  Insect sting reaction Recently treated with Prednisone 10mg  and Hydroxyzine for inflammation due to Asian elant sting. Inflammation resolved. -Completed Prednisone course.  Alcohol consumption Reports increased intake due to stress. -Advise moderation in alcohol consumption, ideally no more than  one to  two drinks per day.  Colon Cancer Screening Discussed options for colon cancer screening. Patient declined colonoscopy due to insurance coverage. -Provide OC-Lyte kit for initial screening.     No follow-ups on file.        Mila Merry, MD  [facility] [phone] (phone) [phone] (fax)  [facility]

## 2022-12-06 LAB — CBC
Hematocrit: 45.6 % (ref 37.5–51.0)
Hemoglobin: 14.5 g/dL (ref 13.0–17.7)
MCH: 29.5 pg (ref 26.6–33.0)
MCHC: 31.8 g/dL (ref 31.5–35.7)
MCV: 93 fL (ref 79–97)
Platelets: 346 10*3/uL (ref 150–450)
RBC: 4.92 x10E6/uL (ref 4.14–5.80)
RDW: 13.1 % (ref 11.6–15.4)
WBC: 8.4 10*3/uL (ref 3.4–10.8)

## 2022-12-06 LAB — TSH: TSH: 2.72 u[IU]/mL (ref 0.450–4.500)

## 2022-12-06 LAB — HEPATITIS C ANTIBODY: Hep C Virus Ab: NONREACTIVE

## 2022-12-06 LAB — COMPREHENSIVE METABOLIC PANEL
ALT: 52 [IU]/L — ABNORMAL HIGH (ref 0–44)
AST: 23 [IU]/L (ref 0–40)
Albumin: 4.5 g/dL (ref 4.1–5.1)
Alkaline Phosphatase: 69 [IU]/L (ref 44–121)
BUN/Creatinine Ratio: 18 (ref 9–20)
BUN: 18 mg/dL (ref 6–24)
Bilirubin Total: 0.3 mg/dL (ref 0.0–1.2)
CO2: 24 mmol/L (ref 20–29)
Calcium: 9.9 mg/dL (ref 8.7–10.2)
Chloride: 103 mmol/L (ref 96–106)
Creatinine, Ser: 1.01 mg/dL (ref 0.76–1.27)
Globulin, Total: 2.6 g/dL (ref 1.5–4.5)
Glucose: 83 mg/dL (ref 70–99)
Potassium: 4.7 mmol/L (ref 3.5–5.2)
Sodium: 142 mmol/L (ref 134–144)
Total Protein: 7.1 g/dL (ref 6.0–8.5)
eGFR: 93 mL/min/{1.73_m2} (ref >59)

## 2022-12-06 LAB — LIPID PANEL
Chol/HDL Ratio: 3.8 {ratio} (ref 0.0–5.0)
Cholesterol, Total: 213 mg/dL — ABNORMAL HIGH (ref 100–199)
HDL: 56 mg/dL (ref >39)
LDL Chol Calc (NIH): 123 mg/dL — ABNORMAL HIGH (ref 0–99)
Triglycerides: 193 mg/dL — ABNORMAL HIGH (ref 0–149)
VLDL Cholesterol Cal: 34 mg/dL (ref 5–40)

## 2022-12-08 ENCOUNTER — Other Ambulatory Visit: Payer: Self-pay | Admitting: Family Medicine

## 2022-12-08 ENCOUNTER — Encounter: Payer: Self-pay | Admitting: Family Medicine

## 2022-12-08 DIAGNOSIS — Z8659 Personal history of other mental and behavioral disorders: Secondary | ICD-10-CM

## 2022-12-08 DIAGNOSIS — F411 Generalized anxiety disorder: Secondary | ICD-10-CM

## 2022-12-08 DIAGNOSIS — I10 Essential (primary) hypertension: Secondary | ICD-10-CM

## 2022-12-08 MED ORDER — LOSARTAN POTASSIUM 100 MG PO TABS
100.0000 mg | ORAL_TABLET | Freq: Every day | ORAL | 2 refills | Status: DC
Start: 2022-12-08 — End: 2022-12-08

## 2022-12-08 MED ORDER — LOSARTAN POTASSIUM 100 MG PO TABS
100.0000 mg | ORAL_TABLET | Freq: Every day | ORAL | 2 refills | Status: DC
Start: 2022-12-08 — End: 2023-05-28

## 2022-12-09 NOTE — Telephone Encounter (Signed)
Requested medication (s) are due for refill today:   Provider to review  Requested medication (s) are on the active medication list:   Yes  Future visit scheduled:   Yes 11/6 with Dr. Sherrie Mustache   Last ordered: 11/08/2022 #30, 0 refills  Non delegated refill    Requested Prescriptions  Pending Prescriptions Disp Refills   clonazePAM (KLONOPIN) 0.5 MG tablet [Pharmacy Med Name: CLONAZEPAM 0.5MG  TABLETS] 30 tablet     Sig: TAKE 1 TABLET BY MOUTH EVERY MORNING     Not Delegated - Psychiatry: Anxiolytics/Hypnotics 2 Failed - 12/08/2022  8:19 AM      Failed - This refill cannot be delegated      Failed - Urine Drug Screen completed in last 360 days      Passed - Patient is not pregnant      Passed - Valid encounter within last 6 months    Recent Outpatient Visits           4 days ago Annual physical exam   [facility] Malva Limes, MD   1 year ago Primary hypertension   [facility] Malva Limes, MD   1 year ago Primary hypertension   [facility] Malva Limes, MD   1 year ago Cellulitis of left lower extremity   [facility] Alfredia Ferguson, PA-C   1 year ago Upper respiratory tract infection, unspecified type   [facility] Malva Limes, MD       Future Appointments             In 3 weeks Fisher, Demetrios Isaacs, MD [facility]

## 2022-12-31 ENCOUNTER — Ambulatory Visit (INDEPENDENT_AMBULATORY_CARE_PROVIDER_SITE_OTHER): Payer: 59 | Admitting: Family Medicine

## 2022-12-31 VITALS — BP 130/87 | HR 69 | Temp 97.9°F | Ht 70.0 in | Wt 209.0 lb

## 2022-12-31 DIAGNOSIS — I10 Essential (primary) hypertension: Secondary | ICD-10-CM

## 2022-12-31 NOTE — Progress Notes (Signed)
      Established patient visit   Patient: Glenn Rodriguez   DOB: 12/27/1975   47 y.o. Male  MRN: [id number] Visit Date: 12/31/2022  Today's healthcare provider: Mila Merry, MD   Chief Complaint  Patient presents with   Hypertension   Subjective    Here to follow up hypertension since doubling losartan to 100mg  daily a few weeks ago. Reports no adverse effects from medication and BP has been much more stable since changing dose.    Medications: Outpatient Medications Prior to Visit  Medication Sig   clonazePAM (KLONOPIN) 0.5 MG tablet TAKE 1 TABLET BY MOUTH EVERY MORNING   hydrOXYzine (ATARAX) 10 MG tablet Take 1 tablet (10 mg total) by mouth 3 (three) times daily as needed.   losartan (COZAAR) 100 MG tablet Take 1 tablet (100 mg total) by mouth daily.   triamcinolone cream (KENALOG) 0.1 % APPLY TOPICALLY TO THE AFFECTED AREA TWICE DAILY   No facility-administered medications prior to visit.   Review of Systems  Constitutional:  Negative for appetite change, chills and fever.  Respiratory:  Negative for chest tightness, shortness of breath and wheezing.   Cardiovascular:  Negative for chest pain and palpitations.  Gastrointestinal:  Negative for abdominal pain, nausea and vomiting.       Objective    BP 130/87 (BP Location: Left Arm, Patient Position: Sitting, Cuff Size: Normal)   Pulse 69   Temp 97.9 F (36.6 C) (Oral)   Ht 5\' 10"  (1.778 m)   Wt 209 lb (94.8 kg)   SpO2 99%   BMI 29.99 kg/m   Physical Exam  General appearance: Well developed, well nourished male, cooperative and in no acute distress Head: Normocephalic, without obvious abnormality, atraumatic Respiratory: Respirations even and unlabored, normal respiratory rate Extremities: All extremities are intact.  Skin: Skin color, texture, turgor normal. No rashes seen  Psych: Appropriate mood and affect. Neurologic: Mental status: Alert, oriented to person, place, and time, thought content appropriate.    Assessment & Plan     1. Primary hypertension Much better with increase to 100mg  losartan.  - Renal function panel        Mila Merry, MD  [facility] [phone] (phone) [phone] (fax)  [facility]

## 2023-01-01 LAB — RENAL FUNCTION PANEL
Albumin: 4.9 g/dL (ref 4.1–5.1)
BUN/Creatinine Ratio: 16 (ref 9–20)
BUN: 17 mg/dL (ref 6–24)
CO2: 22 mmol/L (ref 20–29)
Calcium: 10.2 mg/dL (ref 8.7–10.2)
Chloride: 101 mmol/L (ref 96–106)
Creatinine, Ser: 1.09 mg/dL (ref 0.76–1.27)
Glucose: 95 mg/dL (ref 70–99)
Phosphorus: 4 mg/dL (ref 2.8–4.1)
Potassium: 4.8 mmol/L (ref 3.5–5.2)
Sodium: 139 mmol/L (ref 134–144)
eGFR: 85 mL/min/{1.73_m2} (ref >59)

## 2023-04-06 ENCOUNTER — Other Ambulatory Visit: Payer: Self-pay | Admitting: Family Medicine

## 2023-04-06 DIAGNOSIS — Z8659 Personal history of other mental and behavioral disorders: Secondary | ICD-10-CM

## 2023-04-06 DIAGNOSIS — F411 Generalized anxiety disorder: Secondary | ICD-10-CM

## 2023-05-28 ENCOUNTER — Other Ambulatory Visit: Payer: Self-pay | Admitting: Family Medicine

## 2023-05-28 DIAGNOSIS — I10 Essential (primary) hypertension: Secondary | ICD-10-CM

## 2023-07-02 ENCOUNTER — Other Ambulatory Visit: Payer: Self-pay | Admitting: Family Medicine

## 2023-07-02 DIAGNOSIS — F411 Generalized anxiety disorder: Secondary | ICD-10-CM

## 2023-07-02 DIAGNOSIS — Z8659 Personal history of other mental and behavioral disorders: Secondary | ICD-10-CM

## 2023-09-04 ENCOUNTER — Ambulatory Visit: Payer: Self-pay | Admitting: *Deleted

## 2023-09-04 ENCOUNTER — Encounter: Payer: Self-pay | Admitting: Family Medicine

## 2023-09-04 ENCOUNTER — Ambulatory Visit: Admitting: Family Medicine

## 2023-09-04 VITALS — BP 135/101 | HR 68 | Temp 98.8°F | Resp 16 | Ht 69.0 in | Wt 216.5 lb

## 2023-09-04 DIAGNOSIS — R21 Rash and other nonspecific skin eruption: Secondary | ICD-10-CM | POA: Diagnosis not present

## 2023-09-04 DIAGNOSIS — W57XXXA Bitten or stung by nonvenomous insect and other nonvenomous arthropods, initial encounter: Secondary | ICD-10-CM | POA: Diagnosis not present

## 2023-09-04 DIAGNOSIS — R509 Fever, unspecified: Secondary | ICD-10-CM | POA: Diagnosis not present

## 2023-09-04 MED ORDER — DOXYCYCLINE HYCLATE 100 MG PO TABS: 100.0000 mg | ORAL_TABLET | Freq: Two times a day (BID) | ORAL | 0 refills | Status: AC

## 2023-09-04 NOTE — Telephone Encounter (Signed)
 FYI Only or Action Required?: FYI only for provider.  Patient was last seen in primary care on 12/31/2022 by Gasper Nancyann BRAVO, MD.  Called Nurse Triage reporting Generalized Body Aches.  Symptoms began several days ago.  Interventions attempted: Nothing.  Symptoms are: gradually worsening.  Triage Disposition: See Physician Within 24 Hours  Patient/caregiver understands and will follow disposition?: yes  Reason for Disposition  [1] 2 to 14 days following tick bite AND [2] widespread rash or headache AND [3] no fever  Answer Assessment - Initial Assessment Questions 1. ONSET: When did the muscle aches or body pains start?      Several days- hip/lower back body aches- left leg and joints effected, low grade temp, rash on left leg 2. LOCATION: What part of your body is hurting? (e.g., entire body, arms, legs)      Left leg 3. SEVERITY: How bad is the pain? (Scale 1-10; or mild, moderate, severe)     Mild pain in L knee and hip 4. CAUSE: What do you think is causing the pains?     Hx Lyme disease- 3 ticks around end of May  5. FEVER: Do you have a fever? If Yes, ask: What is your temperature, how was it measured, and  when did it start?      Low grade- 99.5-100 2 nights ago 6. OTHER SYMPTOMS: Do you have any other symptoms? (e.g., chest pain, cold or flu symptoms, rash, weakness, weight loss)     *No Answer*  Answer Assessment - Initial Assessment Questions 1. ATTACHED:  Is the tick still on the skin?  (e.g., yes, no, unsure)     no 2. ONSET - TICK STILL ATTACHED:  How long do you think the tick has been on your skin? (e.g., hours, days, unsure)  Note:  Is there a recent activity (camping, hiking) where the caller may have been exposed?     unsure 3. ONSET - TICK NOT STILL ATTACHED: If the tick has been removed, how long do you think the tick was attached before you removed it? (e.g., 5 hours, 2 days). When was this?     End of May  Protocols used: Muscle  Aches and Body Pain-A-AH, Tick Bite-A-AH    Copied from CRM [id number]. Topic: Clinical - Red Word Triage >> Sep 04, 2023  8:08 AM Silvana PARAS wrote: Red Word that prompted transfer to Nurse Triage: Body aches and moreso hips, shoulders and entire body. Fever and chills are gone. Splotchy rash on left leg, and bottom. Pulled 3 ticks off around June. Has had Lyme disease before in the past.

## 2023-09-04 NOTE — Progress Notes (Signed)
 Established patient visit   Patient: Glenn Rodriguez   DOB: 03/28/1975   47 y.o. Male  MRN: [id number] Visit Date: 09/04/2023  Today's healthcare provider: Nancyann Perry, MD   Chief Complaint  Patient presents with   Generalized Body Aches    Associated symptoms: rash started yesterday, left leg, had low grade fever. Frequency: x 4-7 days   Subjective    Discussed the use of AI scribe software for clinical note transcription with the patient, who gave verbal consent to proceed.  History of Present Illness   Glenn Rodriguez is a 48 year old male who presents with fever, rash, and body aches following tick bites.  He removed three ticks from himself between late May and early June. Initially, he did not experience significant symptoms such as a bull's eye rash. However, after being out in the heat last week, he began experiencing soreness in the backs of his legs from bending over.  By Saturday, he developed achy hips and flu-like body aches. He also experienced a fiery, prickly sensation in the front of his thigh, with tingling extending to his toes, which he initially thought might be related to a sciatic nerve issue. Ibuprofen provided some relief.  Two days ago, he felt extremely fatigued after work and developed a fever of 99.5 to 100F. Despite turning up the air conditioning, he felt cold and was 'chattering' while wearing warm clothing. The fever resolved overnight, but he continued to feel achy, particularly in his left hip and knee.  He noticed a splotchy rash on his left leg, including the back of his thigh, under his knee, around his knee and shin, and on his foot. The rash does not itch but appears blistery. He denies any recent exposure to poison ivy or poison oak. The rash is only present on his left leg.  He has a history of Lyme disease approximately two years ago, which presented with a rash that worsened over time. He was treated with doxycycline  for six weeks, which  resolved the rash. He recalls the ticks he removed recently were lone star ticks, with one located on his scrotum.  In the review of symptoms, he denies cold symptoms such as runny nose or sore throat, but mentions a stiff neck and sore lower back. He also reports extra sensitivity to touch around his love handles and stomach. He experienced a mild cough but no stomach pain, nausea, or vomiting. He notes that doxycycline  was rough on his stomach, causing diarrhea and nausea, but he can tolerate it with food.       Medications: Outpatient Medications Prior to Visit  Medication Sig   clonazePAM  (KLONOPIN ) 0.5 MG tablet TAKE 1 TABLET BY MOUTH EVERY MORNING   hydrOXYzine  (ATARAX ) 10 MG tablet Take 1 tablet (10 mg total) by mouth 3 (three) times daily as needed.   losartan  (COZAAR ) 100 MG tablet TAKE 1 TABLET(100 MG) BY MOUTH DAILY   triamcinolone  cream (KENALOG ) 0.1 % APPLY TOPICALLY TO THE AFFECTED AREA TWICE DAILY   No facility-administered medications prior to visit.   Review of Systems     Objective    BP (!) 135/101 (BP Location: Left Arm, Patient Position: Sitting, Cuff Size: Normal)   Pulse 68   Temp 98.8 F (37.1 C) (Oral)   Resp 16   Ht 5' 9 (1.753 m)   Wt 216 lb 8 oz (98.2 kg)   SpO2 98%   BMI 31.97 kg/m   Physical Exam  General: Appearance:    Mildly obese male in no acute distress  Eyes:    PERRL, conjunctiva/corneas clear, EOM's intact       Lungs:     Clear to auscultation bilaterally, respirations unlabored  Heart:    Normal heart rate. Normal rhythm. No murmurs, rubs, or gallops.    MS:   All extremities are intact.    Neurologic:   Awake, alert, oriented x 3. No apparent focal neurological defect.       Left LE    Assessment & Plan     1. Rash (Primary)   2. Fever, unspecified fever cause   3. Tick bite, unspecified site, initial encounter  - Comprehensive metabolic panel with GFR - Lyme Disease Serology w/Reflex - [facility]  Antibodies - CBC with Differential/Platelet - C-reactive protein - doxycycline  (VIBRA -TABS) 100 MG tablet; Take 1 tablet (100 mg total) by mouth 2 (two) times daily for 14 days.  Dispense: 28 tablet; Refill: 0     Nancyann Perry, MD  [facility] [phone] (phone) [phone] (fax)  [facility]

## 2023-09-07 ENCOUNTER — Other Ambulatory Visit: Payer: Self-pay | Admitting: Family Medicine

## 2023-09-07 ENCOUNTER — Ambulatory Visit: Payer: Self-pay

## 2023-09-07 ENCOUNTER — Encounter: Payer: Self-pay | Admitting: Family Medicine

## 2023-09-07 ENCOUNTER — Ambulatory Visit: Admitting: Family Medicine

## 2023-09-07 DIAGNOSIS — R21 Rash and other nonspecific skin eruption: Secondary | ICD-10-CM

## 2023-09-07 MED ORDER — VALACYCLOVIR HCL 1 G PO TABS: 1000.0000 mg | ORAL_TABLET | Freq: Three times a day (TID) | ORAL | 0 refills | Status: AC

## 2023-09-07 NOTE — Telephone Encounter (Signed)
 FYI Only or Action Required?: FYI only for provider.  Patient was last seen in primary care on 09/04/2023 by Gasper Nancyann BRAVO, MD.  Called Nurse Triage reporting Rash.  Symptoms began a week ago.  Interventions attempted: Prescription medications: Doxycycline .  Symptoms are: gradually worsening.  Triage Disposition: See HCP Within 4 Hours (Or PCP Triage)  Patient/caregiver understands and will follow disposition?: Yes     Copied from CRM [id number]. Topic: Clinical - Red Word Triage >> Sep 07, 2023  8:08 AM Glenn Rodriguez wrote: Red Word that prompted transfer to Nurse Triage: Patient is calling to report that he was seen 09/04/2023 for rash. At this time suspected limes rash.  Prescribed doxycycline  (VIBRA -TABS) 100 MG tablet [[id number]]. Patient is report that he has been taking the script as prescribed and the rash is not improving . Rash is with painful with red dots. Spread to shin and top of left foot. Rash on buttock, left knee, leg and foot. Patient works for pharmacy suspects shingles. Had fever on Wednesday into Thursday Came in on Friday. Not reporting anymore fever. Body aches starting Monday. Reason for Disposition  Large or small blisters on skin (i.e., fluid filled bubbles or sacs)  Answer Assessment - Initial Assessment Questions Patient was seen in office on Friday. Patient states he is having worsened rash and it is now more painful and spreading.    1. APPEARANCE of RASH: What does the rash look like? (e.g., blisters, dry flaky skin, red spots, redness, sores)     Dark red spots, blisters, hot to touch, raised, painful. Patient doesn't notice drainage but states there is crustiness to the spots.  2. SIZE: How big are the spots? (e.g., tip of pen, eraser, coin; inches, centimeters)     Patient will upload a picture  3. LOCATION: Where is the rash located?     Patch on buttocks, string patch on left inside thigh, wraps around knee and shin on left leg, down top of  foot  4. COLOR: What color is the rash? (Note: It is difficult to assess rash color in people with darker-colored skin. When this situation occurs, simply ask the caller to describe what they see.)     Dark red  5. ONSET: When did the rash begin?     Monday of last week - patient had body aches and fatigue. Wednesday night he ran a fever and had chills. Broke fever on Wednesday morning. Rash started Friday morning 09/04/23.  Since Friday, rash has spread and gotten worse.  6. FEVER: Do you have a fever? If Yes, ask: What is your temperature, how was it measured, and when did it start?     No fever since Wednesday  7. ITCHING: Does the rash itch? If Yes, ask: How bad is the itch? (Scale 1-10; or mild, moderate, severe)     No itching  8. CAUSE: What do you think is causing the rash?     Patient is now suspecting shingles  9. MEDICINE FACTORS: Have you started any new medicines within the last 2 weeks? (e.g., antibiotics)      Doxycycline  because it was assumed he had limes rash - but he states the rash has gotten worse since starting the medication.   10. OTHER SYMPTOMS: Do you have any other symptoms? (e.g., dizziness, headache, sore throat, joint pain)       Patient states knee is now swollen, painful, and tight. Patient is having muscle pain in left quad. Patient has burning sensation  on skin.  Protocols used: Rash or Redness - [facility]

## 2023-09-07 NOTE — Telephone Encounter (Signed)
 Labs not back yet. Can start valacyclovir  to cover for shingles for the time being. Have sent prescription to [facility]

## 2023-09-07 NOTE — Telephone Encounter (Signed)
 Patient was given message by E2C2. He was scheduled to be seen today but has canceled appointment since prescription was sent in.

## 2023-09-07 NOTE — Telephone Encounter (Signed)
 This RN spoke to patient who stated he missed call from CAL.  This RN contacted CAL who stated that patient should be notified that valcyclovir was called in. CAL advised that it is up to patient to keep or cancel appointment today.  Message re: valcyclovir relayed to Patient. Patient chose to cancel appointment. Advised patient to call back if symptoms do not improve.  Copied from CRM [id number]. Topic: Clinical - Red Word Triage >> Sep 07, 2023 10:56 AM Carlatta H wrote: [facility] that prompted transfer to Nurse Triage: LMTCB,Triage Nurse may give patient results/message

## 2023-09-10 ENCOUNTER — Ambulatory Visit: Payer: Self-pay | Admitting: Family Medicine

## 2023-09-10 LAB — CBC WITH DIFFERENTIAL/PLATELET
Basophils Absolute: 0 x10E3/uL (ref 0.0–0.2)
Basos: 0 %
EOS (ABSOLUTE): 0.3 x10E3/uL (ref 0.0–0.4)
Eos: 6 %
Hematocrit: 41.7 % (ref 37.5–51.0)
Hemoglobin: 14.1 g/dL (ref 13.0–17.7)
Immature Grans (Abs): 0 x10E3/uL (ref 0.0–0.1)
Immature Granulocytes: 0 %
Lymphocytes Absolute: 1.4 x10E3/uL (ref 0.7–3.1)
Lymphs: 27 %
MCH: 30.6 pg (ref 26.6–33.0)
MCHC: 33.8 g/dL (ref 31.5–35.7)
MCV: 91 fL (ref 79–97)
Monocytes Absolute: 0.6 x10E3/uL (ref 0.1–0.9)
Monocytes: 12 %
Neutrophils Absolute: 2.8 x10E3/uL (ref 1.4–7.0)
Neutrophils: 55 %
Platelets: 288 x10E3/uL (ref 150–450)
RBC: 4.61 x10E6/uL (ref 4.14–5.80)
RDW: 12.7 % (ref 11.6–15.4)
WBC: 5.2 x10E3/uL (ref 3.4–10.8)

## 2023-09-10 LAB — COMPREHENSIVE METABOLIC PANEL WITH GFR
ALT: 54 IU/L — ABNORMAL HIGH (ref 0–44)
AST: 35 IU/L (ref 0–40)
Albumin: 4.5 g/dL (ref 4.1–5.1)
Alkaline Phosphatase: 72 IU/L (ref 44–121)
BUN/Creatinine Ratio: 14 (ref 9–20)
BUN: 14 mg/dL (ref 6–24)
Bilirubin Total: 0.4 mg/dL (ref 0.0–1.2)
CO2: 17 mmol/L — ABNORMAL LOW (ref 20–29)
Calcium: 9.6 mg/dL (ref 8.7–10.2)
Chloride: 103 mmol/L (ref 96–106)
Creatinine, Ser: 1.03 mg/dL (ref 0.76–1.27)
Globulin, Total: 2.4 g/dL (ref 1.5–4.5)
Glucose: 83 mg/dL (ref 70–99)
Potassium: 4.6 mmol/L (ref 3.5–5.2)
Sodium: 142 mmol/L (ref 134–144)
Total Protein: 6.9 g/dL (ref 6.0–8.5)
eGFR: 91 mL/min/1.73 (ref >59)

## 2023-09-10 LAB — SPOTTED FEVER GROUP ANTIBODIES
Spotted Fever Group IgG: <1:64 {titer}
Spotted Fever Group IgM: <1:64 {titer}

## 2023-09-10 LAB — C-REACTIVE PROTEIN: CRP: 7 mg/L (ref 0–10)

## 2023-09-10 LAB — LYME DISEASE SEROLOGY W/REFLEX

## 2023-10-30 ENCOUNTER — Other Ambulatory Visit: Payer: Self-pay | Admitting: Family Medicine

## 2023-10-30 DIAGNOSIS — F411 Generalized anxiety disorder: Secondary | ICD-10-CM

## 2023-10-30 DIAGNOSIS — Z8659 Personal history of other mental and behavioral disorders: Secondary | ICD-10-CM

## 2023-12-29 ENCOUNTER — Other Ambulatory Visit: Payer: Self-pay | Admitting: Family Medicine

## 2023-12-29 DIAGNOSIS — F411 Generalized anxiety disorder: Secondary | ICD-10-CM

## 2023-12-29 DIAGNOSIS — Z8659 Personal history of other mental and behavioral disorders: Secondary | ICD-10-CM

## 2024-02-19 ENCOUNTER — Other Ambulatory Visit: Payer: Self-pay | Admitting: Family Medicine

## 2024-02-19 DIAGNOSIS — I10 Essential (primary) hypertension: Secondary | ICD-10-CM

## 2024-03-25 ENCOUNTER — Other Ambulatory Visit: Payer: Self-pay | Admitting: Family Medicine

## 2024-03-25 DIAGNOSIS — F411 Generalized anxiety disorder: Secondary | ICD-10-CM

## 2024-03-25 DIAGNOSIS — Z8659 Personal history of other mental and behavioral disorders: Secondary | ICD-10-CM

## 2024-03-25 NOTE — Telephone Encounter (Signed)
 LOV- 09/04/2023 NOV- None LRF- 12/29/2023 Outpatient Medication Detail   Disp Refills Start End   clonazePAM  (KLONOPIN ) 0.5 MG tablet 30 tablet 2 12/29/2023 --   Sig - Route: TAKE 1 TABLET BY MOUTH EVERY MORNING - Oral   Sent to pharmacy as: clonazePAM  (KLONOPIN ) 0.5 MG tablet   E-Prescribing Status: Receipt confirmed by pharmacy (12/29/2023  7:35 AM EST)
